# Patient Record
Sex: Male | Born: 1955 | Hispanic: No | Marital: Married
Health system: Southern US, Community
[De-identification: ages and names within clinical notes are randomized; demographics above are authoritative.]

---

## 2017-11-25 ENCOUNTER — Inpatient Hospital Stay (HOSPITAL_COMMUNITY)
Admission: EM | Admit: 2017-11-25 | Discharge: 2017-11-27 | Disposition: A | Discharge status: Home / Self Care | Payer: Self-pay | Attending: Cardiology | Admitting: Cardiology

## 2017-11-25 ENCOUNTER — Other Ambulatory Visit: Payer: Self-pay

## 2017-11-25 ENCOUNTER — Emergency Department (HOSPITAL_COMMUNITY): Payer: Self-pay

## 2017-11-25 DIAGNOSIS — Z720 Tobacco use: Secondary | ICD-10-CM

## 2017-11-25 DIAGNOSIS — Z955 Presence of coronary angioplasty implant and graft: Secondary | ICD-10-CM

## 2017-11-25 DIAGNOSIS — R079 Chest pain, unspecified: Principal | ICD-10-CM

## 2017-11-25 DIAGNOSIS — I471 Supraventricular tachycardia: Secondary | ICD-10-CM

## 2017-11-25 DIAGNOSIS — I214 Non-ST elevation (NSTEMI) myocardial infarction: Secondary | ICD-10-CM

## 2017-11-25 DIAGNOSIS — I21A1 Myocardial infarction type 2: Secondary | ICD-10-CM

## 2017-11-25 DIAGNOSIS — F1010 Alcohol abuse, uncomplicated: Secondary | ICD-10-CM

## 2017-11-25 LAB — I-STAT TROPONIN, ED
Troponin i, poc: 0.26 ng/mL (ref 0.00–0.08)
Troponin i, poc: 0.68 ng/mL (ref 0.00–0.08)

## 2017-11-25 LAB — CBC
HCT: 39.7 % (ref 39.0–52.0)
Hemoglobin: 13.7 g/dL (ref 13.0–17.0)
MCH: 32 pg (ref 26.0–34.0)
MCHC: 34.5 g/dL (ref 30.0–36.0)
MCV: 92.8 fL (ref 78.0–100.0)
Platelets: 203 10*3/uL (ref 150–400)
RBC: 4.28 MIL/uL (ref 4.22–5.81)
RDW: 12.8 % (ref 11.5–15.5)
WBC: 6.6 10*3/uL (ref 4.0–10.5)

## 2017-11-25 LAB — BASIC METABOLIC PANEL
Anion gap: 12 (ref 5–15)
BUN: 11 mg/dL (ref 6–20)
CO2: 20 mmol/L — ABNORMAL LOW (ref 22–32)
Calcium: 8.9 mg/dL (ref 8.9–10.3)
Chloride: 99 mmol/L — ABNORMAL LOW (ref 101–111)
Creatinine, Ser: 1.02 mg/dL (ref 0.61–1.24)
GFR calc Af Amer: >60 mL/min (ref >60)
GFR calc non Af Amer: >60 mL/min (ref >60)
Glucose, Bld: 151 mg/dL — ABNORMAL HIGH (ref 65–99)
Potassium: 3.6 mmol/L (ref 3.5–5.1)
Sodium: 131 mmol/L — ABNORMAL LOW (ref 135–145)

## 2017-11-25 LAB — HEPATIC FUNCTION PANEL
ALT: 21 U/L (ref 17–63)
AST: 33 U/L (ref 15–41)
Albumin: 3.9 g/dL (ref 3.5–5.0)
Alkaline Phosphatase: 56 U/L (ref 38–126)
Bilirubin, Direct: <0.1 mg/dL — ABNORMAL LOW (ref 0.1–0.5)
Total Bilirubin: 0.6 mg/dL (ref 0.3–1.2)
Total Protein: 7.1 g/dL (ref 6.5–8.1)

## 2017-11-25 LAB — T4, FREE: Free T4: 0.89 ng/dL (ref 0.61–1.12)

## 2017-11-25 LAB — PROTIME-INR
INR: 1.21
Prothrombin Time: 15.2 seconds (ref 11.4–15.2)

## 2017-11-25 LAB — HEMOGLOBIN A1C
Hgb A1c MFr Bld: 6.3 % — ABNORMAL HIGH (ref 4.8–5.6)
Mean Plasma Glucose: 134.11 mg/dL

## 2017-11-25 LAB — TROPONIN I: Troponin I: 0.42 ng/mL (ref <0.03)

## 2017-11-25 LAB — TSH: TSH: 0.732 u[IU]/mL (ref 0.350–4.500)

## 2017-11-25 LAB — MAGNESIUM: Magnesium: 1.9 mg/dL (ref 1.7–2.4)

## 2017-11-25 LAB — CBG MONITORING, ED: Glucose-Capillary: 252 mg/dL — ABNORMAL HIGH (ref 65–99)

## 2017-11-25 MED ORDER — SODIUM CHLORIDE 0.9 % IV SOLN: INTRAVENOUS | Status: DC

## 2017-11-25 MED ORDER — HEPARIN (PORCINE) IN NACL 100-0.45 UNIT/ML-% IJ SOLN
1000.0000 [IU]/h | INTRAMUSCULAR | Status: DC
  Administered 2017-11-25: 750 [IU]/h via INTRAVENOUS
  Filled 2017-11-25 (×2): qty 250

## 2017-11-25 MED ORDER — SODIUM CHLORIDE 0.9% FLUSH: 3.0000 mL | INTRAVENOUS | Status: DC | PRN

## 2017-11-25 MED ORDER — FENOFIBRATE 160 MG PO TABS
160.0000 mg | ORAL_TABLET | Freq: Every day | ORAL | Status: DC
  Administered 2017-11-26 – 2017-11-27 (×2): 160 mg via ORAL
  Filled 2017-11-25 (×5): qty 1

## 2017-11-25 MED ORDER — ROSUVASTATIN CALCIUM 40 MG PO TABS: 40.0000 mg | ORAL_TABLET | Freq: Every day | ORAL | Status: DC

## 2017-11-25 MED ORDER — FOLIC ACID 1 MG PO TABS
1.0000 mg | ORAL_TABLET | Freq: Every day | ORAL | Status: DC
  Administered 2017-11-25 – 2017-11-27 (×3): 1 mg via ORAL
  Filled 2017-11-25 (×3): qty 1

## 2017-11-25 MED ORDER — ASPIRIN EC 81 MG PO TBEC
81.0000 mg | DELAYED_RELEASE_TABLET | Freq: Every day | ORAL | Status: DC
  Administered 2017-11-27: 81 mg via ORAL
  Filled 2017-11-25 (×2): qty 1

## 2017-11-25 MED ORDER — METOPROLOL TARTRATE 25 MG PO TABS
50.0000 mg | ORAL_TABLET | Freq: Two times a day (BID) | ORAL | Status: DC
  Administered 2017-11-25 – 2017-11-27 (×4): 50 mg via ORAL
  Filled 2017-11-25 (×4): qty 2

## 2017-11-25 MED ORDER — LORAZEPAM 1 MG PO TABS: 1.0000 mg | ORAL_TABLET | Freq: Four times a day (QID) | ORAL | Status: DC | PRN

## 2017-11-25 MED ORDER — LORAZEPAM 2 MG/ML IJ SOLN: 1.0000 mg | Freq: Four times a day (QID) | INTRAMUSCULAR | Status: DC | PRN

## 2017-11-25 MED ORDER — HEPARIN BOLUS VIA INFUSION
3000.0000 [IU] | Freq: Once | INTRAVENOUS | Status: AC
  Administered 2017-11-25: 3000 [IU] via INTRAVENOUS
  Filled 2017-11-25: qty 3000

## 2017-11-25 MED ORDER — SODIUM CHLORIDE 0.9 % WEIGHT BASED INFUSION: 1.0000 mL/kg/h | INTRAVENOUS | Status: DC

## 2017-11-25 MED ORDER — VITAMIN B-1 100 MG PO TABS
100.0000 mg | ORAL_TABLET | Freq: Every day | ORAL | Status: DC
  Administered 2017-11-25 – 2017-11-27 (×3): 100 mg via ORAL
  Filled 2017-11-25 (×3): qty 1

## 2017-11-25 MED ORDER — CLOPIDOGREL BISULFATE 75 MG PO TABS
75.0000 mg | ORAL_TABLET | Freq: Every day | ORAL | Status: DC
  Administered 2017-11-26: 75 mg via ORAL
  Filled 2017-11-25: qty 1

## 2017-11-25 MED ORDER — SODIUM CHLORIDE 0.9 % WEIGHT BASED INFUSION
3.0000 mL/kg/h | INTRAVENOUS | Status: DC
  Administered 2017-11-26: 3 mL/kg/h via INTRAVENOUS

## 2017-11-25 MED ORDER — NITROGLYCERIN 0.4 MG SL SUBL: 0.4000 mg | SUBLINGUAL_TABLET | SUBLINGUAL | Status: DC | PRN

## 2017-11-25 MED ORDER — PANTOPRAZOLE SODIUM 40 MG PO TBEC
40.0000 mg | DELAYED_RELEASE_TABLET | Freq: Every day | ORAL | Status: DC
  Administered 2017-11-25 – 2017-11-27 (×3): 40 mg via ORAL
  Filled 2017-11-25 (×3): qty 1

## 2017-11-25 MED ORDER — INSULIN ASPART 100 UNIT/ML ~~LOC~~ SOLN
0.0000 [IU] | Freq: Three times a day (TID) | SUBCUTANEOUS | Status: DC
  Administered 2017-11-26: 1 [IU] via SUBCUTANEOUS
  Administered 2017-11-26: 2 [IU] via SUBCUTANEOUS
  Filled 2017-11-25: qty 1

## 2017-11-25 MED ORDER — ACETAMINOPHEN 325 MG PO TABS: 650.0000 mg | ORAL_TABLET | ORAL | Status: DC | PRN

## 2017-11-25 MED ORDER — SODIUM CHLORIDE 0.9 % IV SOLN: 250.0000 mL | INTRAVENOUS | Status: DC | PRN

## 2017-11-25 MED ORDER — ASPIRIN 81 MG PO CHEW
81.0000 mg | CHEWABLE_TABLET | ORAL | Status: AC
  Administered 2017-11-26: 81 mg via ORAL
  Filled 2017-11-25: qty 1

## 2017-11-25 MED ORDER — LISINOPRIL 20 MG PO TABS: 20.0000 mg | ORAL_TABLET | Freq: Every day | ORAL | Status: DC

## 2017-11-25 MED ORDER — ONDANSETRON HCL 4 MG/2ML IJ SOLN
4.0000 mg | Freq: Four times a day (QID) | INTRAMUSCULAR | Status: DC | PRN
  Filled 2017-11-25: qty 2

## 2017-11-25 MED ORDER — ASPIRIN 81 MG PO CHEW: 324.0000 mg | CHEWABLE_TABLET | Freq: Once | ORAL | Status: DC

## 2017-11-25 MED ORDER — SODIUM CHLORIDE 0.9% FLUSH
3.0000 mL | Freq: Two times a day (BID) | INTRAVENOUS | Status: DC
  Administered 2017-11-25: 3 mL via INTRAVENOUS

## 2017-11-25 MED ORDER — INSULIN ASPART 100 UNIT/ML ~~LOC~~ SOLN
0.0000 [IU] | Freq: Every day | SUBCUTANEOUS | Status: DC
  Administered 2017-11-25: 3 [IU] via SUBCUTANEOUS
  Filled 2017-11-25: qty 1

## 2017-11-25 MED ORDER — ADULT MULTIVITAMIN W/MINERALS CH
1.0000 | ORAL_TABLET | Freq: Every day | ORAL | Status: DC
  Administered 2017-11-25 – 2017-11-27 (×3): 1 via ORAL
  Filled 2017-11-25 (×3): qty 1

## 2017-11-25 MED ORDER — THIAMINE HCL 100 MG/ML IJ SOLN
100.0000 mg | Freq: Every day | INTRAMUSCULAR | Status: DC
  Filled 2017-11-25: qty 1
  Filled 2017-11-25: qty 2

## 2017-11-25 MED ORDER — LORAZEPAM 1 MG PO TABS: 0.0000 mg | ORAL_TABLET | Freq: Four times a day (QID) | ORAL | Status: DC

## 2017-11-25 MED ORDER — LORAZEPAM 1 MG PO TABS: 0.0000 mg | ORAL_TABLET | Freq: Two times a day (BID) | ORAL | Status: DC

## 2017-11-25 NOTE — H&P (Addendum)
Cardiology Admission History and Physical:   Patient ID: Jeffery Atkins; MRN: [id number]; DOB: Jun 17, 1956   Admission date: 11/25/2017  Primary Care Provider: Karle Plumber, MD Primary Cardiologist: No primary care provider on file. [place] Primary Electrophysiologist:  none  Chief Complaint:  Chest pain  Patient Profile:   Jeffery Atkins is a 62 y.o. male here with paroxysmal supraventricular tachycardia resolved with adenosine.,  Heavy smoker, heavy alcohol use, chest discomfort with SVT.  Elevated troponin.  History of Present Illness:   Jeffery Atkins is a 62 year old male with no prior documented history of paroxysmal supraventricular tachycardia, heavy smoker, heavy alcohol use who earlier this morning woke up feeling poor, left-sided chest discomfort radiating to his left arm with some generalized weakness.  He went to his primary care physician and was noted to be in supraventricular tachycardia at heart rate 157 bpm.  EMS was called, they gave him 6 mg of adenosine and this converted him to sinus rhythm at 87 bpm with occasional PACs.  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  These were fleeting episodes.  This episode today was the worst.  Nothing seemed to make it better.   He feels better, pain has improved.  His daughters are with him and do state that he drinks quite heavily when he is not at work.  No recent fevers chills nausea vomiting syncope.  He does have an occasional smoker's cough.  No recent increase in mucus production.  He was seen by [facility].  He is currently taking metoprolol tartrate 50 mg 2 times a day, HCTZ 25 mg daily, metformin 1000 mg 2 times a day, fenofibrate 145 mg once a day.    Past Medical History:  Diagnosis Date   Diabetes mellitus without complication (HCC)    Hypertension     History reviewed. No pertinent surgical history.    Medications Prior to Admission: Prior to Admission medications   Not on File     He is currently taking metoprolol tartrate 50 mg 2 times a day, HCTZ 25 mg daily, metformin 1000 mg 2 times a day, fenofibrate 145 mg once a day.   Allergies:   No Known Allergies  Social History:   Social History   Socioeconomic History   Marital status: Married    Spouse name: Not on file   Number of children: Not on file   Years of education: Not on file   Highest education level: Not on file  Occupational History   Not on file  Social Needs   Financial resource strain: Not on file   Food insecurity:    Worry: Not on file    Inability: Not on file   Transportation needs:    Medical: Not on file    Non-medical: Not on file  Tobacco Use   Smoking status: Current Every Day Smoker    Packs/day: 1.00    Types: Cigarettes  Substance and Sexual Activity   Alcohol use: Yes    Comment: 4 days per week   Drug use: Never   Sexual activity: Not on file  Lifestyle   Physical activity:    Days per week: Not on file    Minutes per session: Not on file   Stress: Not on file  Relationships   Social connections:    Talks on phone: Not on file    Gets together: Not on file  Attends religious service: Not on file    Active member of club or organization: Not on file    Attends meetings of clubs or organizations: Not on file    Relationship status: Not on file   Intimate partner violence:    Fear of current or ex partner: Not on file    Emotionally abused: Not on file    Physically abused: Not on file    Forced sexual activity: Not on file  Other Topics Concern   Not on file  Social History Narrative   Not on file    Family History:   The patient's no early family history of CAD  ROS:  Please see the history of present illness.  All other ROS reviewed and negative.     Physical Exam/Data:   Vitals:   11/25/17 1600 11/25/17 1700 11/25/17 1730 11/25/17 1739  BP: 102/67 106/69  104/69   Pulse: (!) 58 60 (!) 58 (!) 59  Resp: 12 12 13 12   Temp:      TempSrc:      SpO2: 97% 97% 97% 96%  Weight:      Height:        Intake/Output Summary (Last 24 hours) at 11/25/2017 1754 Last data filed at 11/25/2017 1632 Gross per 24 hour  Intake 200 ml  Output 1200 ml  Net -1000 ml   Filed Weights   11/25/17 1132  Weight: 142 lb (64.4 kg)   Body mass index is 20.97 kg/m.  General:  Well nourished, well developed, in no acute distress HEENT: normal Lymph: no adenopathy Neck: no JVD Endocrine:  No thryomegaly Vascular: No carotid bruits; FA pulses 2+ bilaterally without bruits  Cardiac:  normal S1, S2; RRR; no murmur  Lungs:  clear to auscultation bilaterally, no wheezing, rhonchi or rales  Abd: soft, nontender, no hepatomegaly  Ext: no edema Musculoskeletal:  No deformities, BUE and BLE strength normal and equal Skin: warm and dry  Neuro:  CNs 2-12 intact, no focal abnormalities noted Psych:  Normal affect    EKG:  The ECG that was done  was personally reviewed and demonstrates sinus rhythm 74 bpm with nonspecific ST-T wave J-point elevation and early precordial leads.  SVT noted on EMS - narrow QRS with retrograde P. 157bpm (reviewed personally and with Dr. Ladona Ridgelaylor. )  Relevant CV Studies: ECHO P  Laboratory Data:  Chemistry Recent Labs  Lab 11/25/17 1134  NA 131*  K 3.6  CL 99*  CO2 20*  GLUCOSE 151*  BUN 11  CREATININE 1.02  CALCIUM 8.9  GFRNONAA >60  GFRAA >60  ANIONGAP 12    No results for input(s): PROT, ALBUMIN, AST, ALT, ALKPHOS, BILITOT in the last 168 hours. Hematology Recent Labs  Lab 11/25/17 1134  WBC 6.6  RBC 4.28  HGB 13.7  HCT 39.7  MCV 92.8  MCH 32.0  MCHC 34.5  RDW 12.8  PLT 203   Cardiac EnzymesNo results for input(s): TROPONINI in the last 168 hours.  Recent Labs  Lab 11/25/17 1210 11/25/17 1510  TROPIPOC 0.26* 0.68*    BNPNo results for input(s): BNP, PROBNP in the last 168 hours.  DDimer No results for  input(s): DDIMER in the last 168 hours.  Radiology/Studies:  Dg Chest 2 View  Result Date: 11/25/2017 CLINICAL DATA:  Tachycardia and left arm pain this morning. History of hypertension, current smoker. Diabetes. EXAM: CHEST - 2 VIEW COMPARISON:  None in PACs FINDINGS: The lungs are adequately inflated. The lung markings are  coarse in the right infrahilar region posteriorly. The heart and pulmonary vascularity are normal. The mediastinum is normal in width. There is no pleural effusion. The bony thorax is unremarkable. IMPRESSION: Right lower lobe atelectasis or early pneumonia. No CHF. Followup PA and lateral chest X-ray is recommended in 3-4 weeks following trial of antibiotic therapy to ensure resolution and exclude underlying malignancy. Electronically Signed   By: [name] M.D.   On: 11/25/2017 13:24    Assessment and Plan:   Type II myocardial infarction, likely demand ischemia in the setting of supraventricular tachycardia in this 62 year old gentleman that is a heavy tobacco user, heavy alcohol use, diabetic on metformin.  Troponin increased from 0.26-0.68. -Likely demand ischemia in the setting of SVT, I think it would be reasonable to proceed with cardiac catheterization tomorrow.  I talked it over with he and his daughters and they are willing to proceed.  Understand risks and benefits of stroke, heart attack, death, renal impairment, bleeding. -I will placed on atorvastatin 80 mg once a day.  - ECHO  Tobacco use -Tobacco cessation counseling  Heavy alcohol use -CIWA protocol  Diabetes with hypertension -We will hold metformin.  I will also hold his HCTZ.  Paroxysmal supraventricular tachycardia -We will go ahead and give him his metoprolol 50 mg p.o. twice daily  Will have follow up with Dr. Lewayne Bunting of EP (discussed with him) for potential ablation.   NOTE: UNDER [facility] AND [facility] THERE IS ANOTHER PATIENT, NOT HIM. I HAVE MADE IT TEAM AWARE. HE DOES  NOT HAVE CAD OR STENTS.  Severity of Illness: The appropriate patient status for this patient is INPATIENT. Inpatient status is judged to be reasonable and necessary in order to provide the required intensity of service to ensure the patient's safety. The patient's presenting symptoms, physical exam findings, and initial radiographic and laboratory data in the context of their chronic comorbidities is felt to place them at high risk for further clinical deterioration. Furthermore, it is not anticipated that the patient will be medically stable for discharge from the hospital within 2 midnights of admission. The following factors support the patient status of inpatient.   " The patient's presenting symptoms include chest pain. " The worrisome physical exam findings include previous racing heart. " The initial radiographic and laboratory data are worrisome because of previous supraventricular tachycardia on ECG with nonspecific ST-T wave changes. " The chronic co-morbidities include diabetes and hypertension.   * I certify that at the point of admission it is my clinical judgment that the patient will require inpatient hospital care spanning beyond 2 midnights from the point of admission due to high intensity of service, high risk for further deterioration and high frequency of surveillance required.*    For questions or updates, please contact [facility] Please consult [email or web address] for contact info under Cardiology/STEMI.    Signed, Donato Schultz, MD  11/25/2017 5:54 PM

## 2017-11-25 NOTE — ED Notes (Signed)
Pt asked to use the bathroom. Pt informed that he needs to use a urinal. Per pt's family member, pt wanted to stand up to use the urinal. Pt informed that someone needed to be present, while he stood up, but pt refused. Pt decided not to use the bathroom.

## 2017-11-25 NOTE — ED Triage Notes (Addendum)
Per [facility], pt [facility] with complaint of chest pressure and left arm pressure that began this morning. [facility] and pt was found to be in SVT upon EMS arrival. Pt given 6 of adenosine and converted. Pt has no complaints now. VSS.

## 2017-11-25 NOTE — ED Notes (Signed)
Pt transferred to hospital bed for comfort.

## 2017-11-25 NOTE — ED Notes (Signed)
Critical troponin of 0.42 paged to the cardiology on call doctor

## 2017-11-25 NOTE — ED Notes (Signed)
PAtient requesting to stand up to use urinal. Patient advised that given his condition this is not safe. Patient proceeds to sit on side of the bed to urinate.

## 2017-11-25 NOTE — ED Notes (Signed)
Lab results given to Dr. Rees. 

## 2017-11-25 NOTE — ED Notes (Signed)
Dr. Madilyn Hookees notified of I stat troponin results

## 2017-11-25 NOTE — ED Notes (Signed)
Pt using urinal at bedside.

## 2017-11-25 NOTE — ED Provider Notes (Signed)
[facility] EMERGENCY DEPARTMENT Provider Note   CSN: [id number] Arrival date & time: 11/25/17  1124     History   Chief Complaint Chief Complaint  Patient presents with  . Chest Pain  . SVT    HPI Jeffery Atkins is a 62 y.o. male.  HPI 61 year old male past medical history significant for dyslipidemia, hypertension, CAD status post PCI in 2010, 2017 probably, diabetes that presents to the emergency department today after being referred by primary care for SVT.  Patient states that this morning he woke up and started having some left-sided chest pain that radiated to his left arm.  Describes the pain as pressure.  Denies any associated shortness of breath with it.  Did report being diaphoretic and nauseous.  Patient went to his primary care doctor where he had an EKG at that time and noted to be in SVT.  EKG EMS was notified.  Patient was given 6 mg of adenosine and converted to sinus rhythm.  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  Patient states he feels completely resolved of any pain or symptoms at this time.  Patient denies any cardiac history however on my review of patient's history he does see [facility] where he has a history of CAD with PCI with several restenosis.  Patient does have history of hypertension hyperlipidemia.  Patient denies any symptoms at this time.  Pt denies any fever, chill, ha, vision changes, lightheadedness, dizziness, congestion, neck pain, sob, cough, abd pain, n/v/d, urinary symptoms, change in bowel habits, melena, hematochezia, lower extremity paresthesias.  Past Medical History:  Diagnosis Date  . Diabetes mellitus without complication (HCC)   . Hypertension     There are no active problems to display for this patient.   History reviewed. No pertinent surgical history.      Home Medications    Prior to Admission medications   Not on File    Family  History History reviewed. No pertinent family history.  Social History Social History   Tobacco Use  . Smoking status: Current Every Day Smoker    Packs/day: 1.00    Types: Cigarettes  Substance Use Topics  . Alcohol use: Yes    Comment: 4 days per week  . Drug use: Never     Allergies   Patient has no known allergies.   Review of Systems Review of Systems  All other systems reviewed and are negative.    Physical Exam Updated Vital Signs BP 123/66   Pulse 92   Temp 98 F (36.7 C) (Oral)   Resp (!) 24   Ht 5\' 9"  (1.753 m)   Wt 64.4 kg (142 lb)   SpO2 97%   BMI 20.97 kg/m   Physical Exam  Constitutional: He is oriented to person, place, and time. He appears well-developed and well-nourished.  Non-toxic appearance. No distress.  HENT:  Head: Normocephalic and atraumatic.  Nose: Nose normal.  Mouth/Throat: Oropharynx is clear and moist.  Eyes: Pupils are equal, round, and reactive to light. Conjunctivae are normal. Right eye exhibits no discharge. Left eye exhibits no discharge.  Neck: Normal range of motion. Neck supple. No JVD present. No tracheal deviation present.  Cardiovascular: Normal rate, regular rhythm, normal heart sounds and intact distal pulses. Exam reveals no gallop and no friction rub.  No murmur heard. Pulmonary/Chest: Effort normal and breath sounds normal. No respiratory distress. He has no decreased breath sounds. He has  no wheezes. He has no rhonchi. He has no rales. He exhibits no tenderness.  No hypoxia or tachypnea.  Abdominal: Soft. Bowel sounds are normal. He exhibits no distension. There is no tenderness. There is no rebound and no guarding.  Musculoskeletal: Normal range of motion.       Right lower leg: Normal. He exhibits no edema.       Left lower leg: Normal. He exhibits no edema.  No lower extremity edema or calf tenderness.  Lymphadenopathy:    He has no cervical adenopathy.  Neurological: He is alert and oriented to person,  place, and time.  Skin: Skin is warm and dry. Capillary refill takes less than 2 seconds. He is not diaphoretic.  Psychiatric: His behavior is normal. Judgment and thought content normal.  Nursing note and vitals reviewed.    ED Treatments / Results  Labs (all labs ordered are listed, but only abnormal results are displayed) Labs Reviewed  BASIC METABOLIC PANEL - Abnormal; Notable for the following components:      Result Value   Sodium 131 (*)    Chloride 99 (*)    CO2 20 (*)    Glucose, Bld 151 (*)    All other components within normal limits  I-STAT TROPONIN, ED - Abnormal; Notable for the following components:   Troponin i, poc 0.26 (*)    All other components within normal limits  CBC    EKG EKG Interpretation  Date/Time:  Monday November 25 2017 11:46:40 EDT Ventricular Rate:  74 PR Interval:  162 QRS Duration: 84 QT Interval:  368 QTC Calculation: 408 R Axis:   68 Text Interpretation:  Normal sinus rhythm with sinus arrhythmia Possible Left atrial enlargement Borderline ECG Confirmed by Tilden Fossaees, Elizabeth [phone]([id number]) on 11/25/2017 12:21:58 PM   Radiology Dg Chest 2 View  Result Date: 11/25/2017 CLINICAL DATA:  Tachycardia and left arm pain this morning. History of hypertension, current smoker. Diabetes. EXAM: CHEST - 2 VIEW COMPARISON:  None in PACs FINDINGS: The lungs are adequately inflated. The lung markings are coarse in the right infrahilar region posteriorly. The heart and pulmonary vascularity are normal. The mediastinum is normal in width. There is no pleural effusion. The bony thorax is unremarkable. IMPRESSION: Right lower lobe atelectasis or early pneumonia. No CHF. Followup PA and lateral chest X-ray is recommended in 3-4 weeks following trial of antibiotic therapy to ensure resolution and exclude underlying malignancy. Electronically Signed   By: David  SwazilandJordan M.D.   On: 11/25/2017 13:24    Procedures Procedures (including critical care time)  Medications  Ordered in ED Medications  aspirin chewable tablet 324 mg (324 mg Oral Not Given 11/25/17 1245)     Initial Impression / Assessment and Plan / ED Course  I have reviewed the triage vital signs and the nursing notes.  Pertinent labs & imaging results that were available during my care of the patient were reviewed by me and considered in my medical decision making (see chart for details).     Patient presents to the emergency department today after being sent by primary care for SVT.  Patient does report some left-sided chest pain and left arm pain when he awoke from sleep this morning that resolved after receiving 6 mg of adenosine.  Patient at this time is pain-free and asymptomatic.  States he feels great.  Overall well-appearing and nontoxic.  Vital signs are reassuring.  Patient is afebrile, no tachycardia noted.  No hypotension noted.  Patient's lab work reveals no  leukocytosis.  Patient's initial troponin was 0.26.  Electrolytes are overall reassuring with some mild hyponatremia 131.  Normal kidney function.  Chest x-ray shows no acute ab normalities.  Clinical presentation not consistent with dissection or PE.  EKG shows normal sinus rhythm without any acute signs of ischemia.  Patient is not in SVT at this time.  Patient is pain-free.  We will consult cardiology for their medications and disposition.  Patient remains hemodynamic stable.  My attending concerning patient's plan of care and she is agreeable.  Care handoff to Dr. Clarene Duke. Pt has pending at this time cardiology consult and dispostion.  Disposition likely admitting pending lab and test results. Care dicussed and plan agreed upon with oncoming PA. Pt updated on plan of care and is currently hemodynamically stable at this time with normal vs.     Final Clinical Impressions(s) / ED Diagnoses   Final diagnoses:  Nonspecific chest pain  SVT (supraventricular tachycardia) [facility])    ED Discharge Orders    None        Wallace Keller 11/25/17 1623    Tilden Fossa, MD 11/26/17 [phone]

## 2017-11-25 NOTE — ED Notes (Signed)
CBG 252 

## 2017-11-25 NOTE — Progress Notes (Signed)
ANTICOAGULATION CONSULT NOTE - Initial Consult  Pharmacy Consult:  Heparin Indication: chest pain/ACS  No Known Allergies  Patient Measurements: Height: 5\' 9"  (175.3 cm) Weight: 142 lb (64.4 kg) IBW/kg (Calculated) : 70.7 Heparin Dosing Weight: 64 kg  Vital Signs: Temp: 98 F (36.7 C) (03/25 1131) Temp Source: Oral (03/25 1131) BP: 122/66 (03/25 2100) Pulse Rate: 63 (03/25 2100)  Labs: Recent Labs    11/25/17 1134  HGB 13.7  HCT 39.7  PLT 203  CREATININE 1.02    Estimated Creatinine Clearance: 70.2 mL/min (by C-G formula based on SCr of 1.02 mg/dL).   Medical History: Past Medical History:  Diagnosis Date  . CAD (coronary atherosclerotic disease) 11/25/2017  . Diabetes mellitus without complication (HCC)   . Hx of heart artery stent 11/25/2017  . Hypertension      Assessment: [age] YOM presented with chest pain and Pharmacy consulted to dose IV heparin.  Troponin trended up to 0.68.  Baseline labs and home meds reviewed.   Goal of Therapy:  Heparin level 0.3-0.7 units/ml Monitor platelets by anticoagulation protocol: Yes    Plan:  Heparin 3000 units IV bolus x 1, then Heparin gtt at 750 units/hr Check 6 hr heparin level Daily heparin level and CBC   Jullian Clayson D. Laney Potashang, PharmD, BCPS Pager:  [phone] 11/25/2017, 9:13 PM

## 2017-11-26 ENCOUNTER — Inpatient Hospital Stay (HOSPITAL_COMMUNITY)
Admission: EM | Disposition: A | Discharge status: Home / Self Care | Payer: Self-pay | Source: Home / Self Care | Attending: Cardiology

## 2017-11-26 ENCOUNTER — Other Ambulatory Visit: Payer: Self-pay

## 2017-11-26 ENCOUNTER — Other Ambulatory Visit (HOSPITAL_COMMUNITY): Payer: Self-pay

## 2017-11-26 DIAGNOSIS — I2510 Atherosclerotic heart disease of native coronary artery without angina pectoris: Secondary | ICD-10-CM

## 2017-11-26 DIAGNOSIS — I214 Non-ST elevation (NSTEMI) myocardial infarction: Secondary | ICD-10-CM

## 2017-11-26 HISTORY — PX: LEFT HEART CATH AND CORONARY ANGIOGRAPHY: CATH118249

## 2017-11-26 HISTORY — PX: CORONARY STENT INTERVENTION: CATH118234

## 2017-11-26 LAB — BASIC METABOLIC PANEL
Anion gap: 11 (ref 5–15)
BUN: 13 mg/dL (ref 6–20)
CO2: 23 mmol/L (ref 22–32)
Calcium: 8.9 mg/dL (ref 8.9–10.3)
Chloride: 104 mmol/L (ref 101–111)
Creatinine, Ser: 1.02 mg/dL (ref 0.61–1.24)
GFR calc Af Amer: >60 mL/min (ref >60)
GFR calc non Af Amer: >60 mL/min (ref >60)
Glucose, Bld: 124 mg/dL — ABNORMAL HIGH (ref 65–99)
Potassium: 3.9 mmol/L (ref 3.5–5.1)
Sodium: 138 mmol/L (ref 135–145)

## 2017-11-26 LAB — GLUCOSE, CAPILLARY
Glucose-Capillary: 126 mg/dL — ABNORMAL HIGH (ref 65–99)
Glucose-Capillary: 174 mg/dL — ABNORMAL HIGH (ref 65–99)
Glucose-Capillary: 124 mg/dL — ABNORMAL HIGH (ref 65–99)

## 2017-11-26 LAB — POCT ACTIVATED CLOTTING TIME: Activated Clotting Time: 296 seconds

## 2017-11-26 LAB — CBG MONITORING, ED: Glucose-Capillary: 126 mg/dL — ABNORMAL HIGH (ref 65–99)

## 2017-11-26 LAB — LIPID PANEL
Cholesterol: 177 mg/dL (ref 0–200)
HDL: 35 mg/dL — ABNORMAL LOW (ref >40)
LDL Cholesterol: UNDETERMINED mg/dL (ref 0–99)
Total CHOL/HDL Ratio: 5.1 RATIO
Triglycerides: 639 mg/dL — ABNORMAL HIGH (ref <150)
VLDL: UNDETERMINED mg/dL (ref 0–40)

## 2017-11-26 LAB — HEPARIN LEVEL (UNFRACTIONATED): Heparin Unfractionated: <0.1 IU/mL — ABNORMAL LOW (ref 0.30–0.70)

## 2017-11-26 LAB — CBC
HCT: 38.7 % — ABNORMAL LOW (ref 39.0–52.0)
Hemoglobin: 13.1 g/dL (ref 13.0–17.0)
MCH: 31.7 pg (ref 26.0–34.0)
MCHC: 33.9 g/dL (ref 30.0–36.0)
MCV: 93.7 fL (ref 78.0–100.0)
Platelets: 220 10*3/uL (ref 150–400)
RBC: 4.13 MIL/uL — ABNORMAL LOW (ref 4.22–5.81)
RDW: 13.1 % (ref 11.5–15.5)
WBC: 4.7 10*3/uL (ref 4.0–10.5)

## 2017-11-26 LAB — TROPONIN I
Troponin I: 0.38 ng/mL (ref <0.03)
Troponin I: 0.27 ng/mL (ref <0.03)

## 2017-11-26 SURGERY — LEFT HEART CATH AND CORONARY ANGIOGRAPHY
Anesthesia: LOCAL

## 2017-11-26 MED ORDER — FENTANYL CITRATE (PF) 100 MCG/2ML IJ SOLN
INTRAMUSCULAR | Status: AC
  Filled 2017-11-26: qty 2

## 2017-11-26 MED ORDER — MIDAZOLAM HCL 2 MG/2ML IJ SOLN
INTRAMUSCULAR | Status: DC | PRN
  Administered 2017-11-26 (×2): 1 mg via INTRAVENOUS

## 2017-11-26 MED ORDER — LABETALOL HCL 5 MG/ML IV SOLN: 10.0000 mg | INTRAVENOUS | Status: DC | PRN

## 2017-11-26 MED ORDER — VERAPAMIL HCL 2.5 MG/ML IV SOLN
INTRAVENOUS | Status: AC
  Filled 2017-11-26: qty 2

## 2017-11-26 MED ORDER — HEPARIN (PORCINE) IN NACL 2-0.9 UNIT/ML-% IJ SOLN
INTRAMUSCULAR | Status: AC
  Filled 2017-11-26: qty 1000

## 2017-11-26 MED ORDER — IOPAMIDOL (ISOVUE-370) INJECTION 76%
INTRAVENOUS | Status: DC | PRN
  Administered 2017-11-26: 150 mL via INTRA_ARTERIAL

## 2017-11-26 MED ORDER — MIDAZOLAM HCL 2 MG/2ML IJ SOLN
INTRAMUSCULAR | Status: AC
  Filled 2017-11-26: qty 2

## 2017-11-26 MED ORDER — TICAGRELOR 90 MG PO TABS
ORAL_TABLET | ORAL | Status: AC
  Filled 2017-11-26: qty 2

## 2017-11-26 MED ORDER — ACTIVE PARTNERSHIP FOR HEALTH OF YOUR HEART BOOK
Freq: Once | Status: AC
  Administered 2017-11-26: 1
  Filled 2017-11-26: qty 1

## 2017-11-26 MED ORDER — HEPARIN BOLUS VIA INFUSION
2000.0000 [IU] | Freq: Once | INTRAVENOUS | Status: AC
  Administered 2017-11-26: 2000 [IU] via INTRAVENOUS
  Filled 2017-11-26: qty 2000

## 2017-11-26 MED ORDER — HEPARIN SODIUM (PORCINE) 1000 UNIT/ML IJ SOLN
INTRAMUSCULAR | Status: AC
  Filled 2017-11-26: qty 1

## 2017-11-26 MED ORDER — NITROGLYCERIN 1 MG/10 ML FOR IR/CATH LAB
INTRA_ARTERIAL | Status: DC | PRN
  Administered 2017-11-26: 200 ug via INTRACORONARY

## 2017-11-26 MED ORDER — IOPAMIDOL (ISOVUE-370) INJECTION 76%
INTRAVENOUS | Status: AC
  Filled 2017-11-26: qty 50

## 2017-11-26 MED ORDER — SODIUM CHLORIDE 0.9% FLUSH
3.0000 mL | Freq: Two times a day (BID) | INTRAVENOUS | Status: DC
  Administered 2017-11-26: 3 mL via INTRAVENOUS

## 2017-11-26 MED ORDER — HYDRALAZINE HCL 20 MG/ML IJ SOLN: 5.0000 mg | INTRAMUSCULAR | Status: DC | PRN

## 2017-11-26 MED ORDER — VERAPAMIL HCL 2.5 MG/ML IV SOLN
INTRAVENOUS | Status: DC | PRN
  Administered 2017-11-26: 10 mL via INTRA_ARTERIAL

## 2017-11-26 MED ORDER — ATORVASTATIN CALCIUM 80 MG PO TABS: 80.0000 mg | ORAL_TABLET | Freq: Every day | ORAL | Status: DC

## 2017-11-26 MED ORDER — THE SENSUOUS HEART BOOK
Freq: Once | Status: AC
  Administered 2017-11-26: 1
  Filled 2017-11-26: qty 1

## 2017-11-26 MED ORDER — PNEUMOCOCCAL VAC POLYVALENT 25 MCG/0.5ML IJ INJ
0.5000 mL | INJECTION | INTRAMUSCULAR | Status: AC
Start: 2017-11-27 — End: 2017-11-27
  Administered 2017-11-27: 0.5 mL via INTRAMUSCULAR
  Filled 2017-11-26: qty 0.5

## 2017-11-26 MED ORDER — IOPAMIDOL (ISOVUE-370) INJECTION 76%
INTRAVENOUS | Status: AC
  Filled 2017-11-26: qty 100

## 2017-11-26 MED ORDER — HEART ATTACK BOUNCING BOOK
Freq: Once | Status: AC
  Administered 2017-11-26: 1
  Filled 2017-11-26: qty 1

## 2017-11-26 MED ORDER — SODIUM CHLORIDE 0.9 % IV SOLN: INTRAVENOUS | Status: DC

## 2017-11-26 MED ORDER — LIDOCAINE HCL (PF) 1 % IJ SOLN
INTRAMUSCULAR | Status: DC | PRN
  Administered 2017-11-26: 2 mL

## 2017-11-26 MED ORDER — SODIUM CHLORIDE 0.9% FLUSH: 3.0000 mL | INTRAVENOUS | Status: DC | PRN

## 2017-11-26 MED ORDER — ANGIOPLASTY BOOK
Freq: Once | Status: AC
  Administered 2017-11-26: 1
  Filled 2017-11-26: qty 1

## 2017-11-26 MED ORDER — NITROGLYCERIN 1 MG/10 ML FOR IR/CATH LAB
INTRA_ARTERIAL | Status: AC
  Filled 2017-11-26: qty 10

## 2017-11-26 MED ORDER — HEPARIN (PORCINE) IN NACL 2-0.9 UNIT/ML-% IJ SOLN
INTRAMUSCULAR | Status: DC | PRN
  Administered 2017-11-26 (×2): 500 mL

## 2017-11-26 MED ORDER — TICAGRELOR 90 MG PO TABS
90.0000 mg | ORAL_TABLET | Freq: Two times a day (BID) | ORAL | Status: DC
  Administered 2017-11-26 – 2017-11-27 (×2): 90 mg via ORAL
  Filled 2017-11-26 (×2): qty 1

## 2017-11-26 MED ORDER — TICAGRELOR 90 MG PO TABS
ORAL_TABLET | ORAL | Status: DC | PRN
  Administered 2017-11-26: 180 mg via ORAL

## 2017-11-26 MED ORDER — FENTANYL CITRATE (PF) 100 MCG/2ML IJ SOLN
INTRAMUSCULAR | Status: DC | PRN
  Administered 2017-11-26 (×2): 25 ug via INTRAVENOUS

## 2017-11-26 MED ORDER — SODIUM CHLORIDE 0.9 % IV SOLN: 250.0000 mL | INTRAVENOUS | Status: DC | PRN

## 2017-11-26 MED ORDER — HEPARIN SODIUM (PORCINE) 1000 UNIT/ML IJ SOLN
INTRAMUSCULAR | Status: DC | PRN
  Administered 2017-11-26: 2000 [IU] via INTRAVENOUS
  Administered 2017-11-26 (×2): 3500 [IU] via INTRAVENOUS

## 2017-11-26 MED ORDER — LIDOCAINE HCL (PF) 1 % IJ SOLN
INTRAMUSCULAR | Status: AC
  Filled 2017-11-26: qty 30

## 2017-11-26 SURGICAL SUPPLY — 19 items
BALLN SAPPHIRE 2.5X12 (BALLOONS) ×2
BALLN SAPPHIRE ~~LOC~~ 3.5X18 (BALLOONS) ×2 IMPLANT
BALLOON SAPPHIRE 2.5X12 (BALLOONS) ×1 IMPLANT
BAND ZEPHYR COMPRESS 30 LONG (HEMOSTASIS) ×2 IMPLANT
CATH IMPULSE 5F ANG/FL3.5 (CATHETERS) ×2 IMPLANT
CATH VISTA GUIDE 6FR XBLAD3.5 (CATHETERS) ×2 IMPLANT
GUIDELINER 6F (CATHETERS) ×2 IMPLANT
GUIDEWIRE INQWIRE 1.5J.035X260 (WIRE) ×1 IMPLANT
INQWIRE 1.5J .035X260CM (WIRE) ×2
KIT ENCORE 26 ADVANTAGE (KITS) ×2 IMPLANT
KIT HEART LEFT (KITS) ×2 IMPLANT
NEEDLE PERC 21GX4CM (NEEDLE) ×2 IMPLANT
PACK CARDIAC CATHETERIZATION (CUSTOM PROCEDURE TRAY) ×2 IMPLANT
SHEATH RAIN RADIAL 21G 6FR (SHEATH) ×2 IMPLANT
STENT SYNERGY DES 3.5X32 (Permanent Stent) ×2 IMPLANT
SYR MEDRAD MARK V 150ML (SYRINGE) ×2 IMPLANT
TRANSDUCER W/STOPCOCK (MISCELLANEOUS) ×2 IMPLANT
TUBING CIL FLEX 10 FLL-RA (TUBING) ×2 IMPLANT
WIRE COUGAR XT STRL 190CM (WIRE) ×2 IMPLANT

## 2017-11-26 NOTE — ED Notes (Signed)
Pre cath lab RN at bedside to obtain consent. Consent at bedside

## 2017-11-26 NOTE — Progress Notes (Signed)
ANTICOAGULATION CONSULT NOTE - Follow Up Consult  Pharmacy Consult for heparin Indication: chest pain/ACS  Labs: Recent Labs    11/25/17 1134 11/25/17 2004 11/25/17 2038 11/26/17 0240  HGB 13.7  --   --   --   HCT 39.7  --   --   --   PLT 203  --   --   --   LABPROT  --  15.2  --   --   INR  --  1.21  --   --   HEPARINUNFRC  --   --   --  <0.10*  CREATININE 1.02  --   --   --   TROPONINI  --   --  0.42* 0.38*    Assessment: 62yo male undetectable on heparin with initial dosing for CP.  Goal of Therapy:  Heparin level 0.3-0.7 units/ml   Plan:  Will rebolus with heparin 2000 units and increase heparin gtt by 4 units/kg/hr to 1000 units/hr and check level in 6 hours.    Jeffery GamblesVeronda Di Atkins, PharmD, BCPS  11/26/2017,4:27 AM

## 2017-11-26 NOTE — ED Notes (Signed)
Pt placed on hospital bed for comfort.

## 2017-11-26 NOTE — Progress Notes (Signed)
C/O shortness of breath. BP up some. Skin w/d. Dr. Clifton JamesMcAlhany made aware. Given a diet coke to drink. Explained to patient and family it may be due to the Brilinta.

## 2017-11-26 NOTE — Interval H&P Note (Signed)
History and Physical Interval Note:  11/26/2017 10:06 AM  Jeffery Atkins  has presented today for cardiac cath with the diagnosis of NSTEMI  The various methods of treatment have been discussed with the patient and family. After consideration of risks, benefits and other options for treatment, the patient has consented to  Procedure(s): LEFT HEART CATH AND CORONARY ANGIOGRAPHY (N/A) as a surgical intervention .  The patient's history has been reviewed, patient examined, no change in status, stable for surgery.  I have reviewed the patient's chart and labs.  Questions were answered to the patient's satisfaction.    Cath Lab Visit (complete for each Cath Lab visit)  Clinical Evaluation Leading to the Procedure:   ACS: Yes.    Non-ACS:    Anginal Classification: CCS III  Anti-ischemic medical therapy: Minimal Therapy (1 class of medications)  Non-Invasive Test Results: No non-invasive testing performed  Prior CABG: No previous CABG       Jeffery Atkins

## 2017-11-27 ENCOUNTER — Telehealth: Payer: Self-pay | Admitting: Cardiology

## 2017-11-27 ENCOUNTER — Inpatient Hospital Stay (HOSPITAL_COMMUNITY): Payer: Self-pay

## 2017-11-27 DIAGNOSIS — I340 Nonrheumatic mitral (valve) insufficiency: Secondary | ICD-10-CM

## 2017-11-27 DIAGNOSIS — F1020 Alcohol dependence, uncomplicated: Secondary | ICD-10-CM

## 2017-11-27 DIAGNOSIS — E782 Mixed hyperlipidemia: Secondary | ICD-10-CM

## 2017-11-27 LAB — CBC
HCT: 38.8 % — ABNORMAL LOW (ref 39.0–52.0)
Hemoglobin: 13.1 g/dL (ref 13.0–17.0)
MCH: 31.4 pg (ref 26.0–34.0)
MCHC: 33.8 g/dL (ref 30.0–36.0)
MCV: 93 fL (ref 78.0–100.0)
Platelets: 210 10*3/uL (ref 150–400)
RBC: 4.17 MIL/uL — ABNORMAL LOW (ref 4.22–5.81)
RDW: 12.8 % (ref 11.5–15.5)
WBC: 6 10*3/uL (ref 4.0–10.5)

## 2017-11-27 LAB — BASIC METABOLIC PANEL
Anion gap: 9 (ref 5–15)
BUN: 14 mg/dL (ref 6–20)
CO2: 24 mmol/L (ref 22–32)
Calcium: 8.7 mg/dL — ABNORMAL LOW (ref 8.9–10.3)
Chloride: 103 mmol/L (ref 101–111)
Creatinine, Ser: 1.06 mg/dL (ref 0.61–1.24)
GFR calc Af Amer: >60 mL/min (ref >60)
GFR calc non Af Amer: >60 mL/min (ref >60)
Glucose, Bld: 123 mg/dL — ABNORMAL HIGH (ref 65–99)
Potassium: 3.8 mmol/L (ref 3.5–5.1)
Sodium: 136 mmol/L (ref 135–145)

## 2017-11-27 LAB — ECHOCARDIOGRAM COMPLETE
Height: 69 in
Weight: 2292.78 oz

## 2017-11-27 LAB — GLUCOSE, CAPILLARY
Glucose-Capillary: 114 mg/dL — ABNORMAL HIGH (ref 65–99)
Glucose-Capillary: 119 mg/dL — ABNORMAL HIGH (ref 65–99)

## 2017-11-27 LAB — HIV ANTIBODY (ROUTINE TESTING W REFLEX): HIV Screen 4th Generation wRfx: NONREACTIVE

## 2017-11-27 MED ORDER — NITROGLYCERIN 0.4 MG SL SUBL: 0.4000 mg | SUBLINGUAL_TABLET | SUBLINGUAL | 3 refills | Status: DC | PRN

## 2017-11-27 MED ORDER — ASPIRIN 81 MG PO TBEC: 81.0000 mg | DELAYED_RELEASE_TABLET | Freq: Every day | ORAL | Status: DC

## 2017-11-27 MED ORDER — TICAGRELOR 90 MG PO TABS: 90.0000 mg | ORAL_TABLET | Freq: Two times a day (BID) | ORAL | 3 refills | Status: DC

## 2017-11-27 MED ORDER — FENOFIBRATE 160 MG PO TABS: 160.0000 mg | ORAL_TABLET | Freq: Every day | ORAL | Status: DC

## 2017-11-27 MED ORDER — PANTOPRAZOLE SODIUM 40 MG PO TBEC: 40.0000 mg | DELAYED_RELEASE_TABLET | Freq: Every day | ORAL | 1 refills | Status: DC

## 2017-11-27 MED ORDER — HYDROCHLOROTHIAZIDE 25 MG PO TABS
25.0000 mg | ORAL_TABLET | Freq: Every day | ORAL | Status: DC
  Administered 2017-11-27: 25 mg via ORAL
  Filled 2017-11-27: qty 1

## 2017-11-27 MED ORDER — ATORVASTATIN CALCIUM 80 MG PO TABS: 80.0000 mg | ORAL_TABLET | Freq: Every day | ORAL | 3 refills | Status: DC

## 2017-11-27 MED ORDER — TICAGRELOR 90 MG PO TABS: 90.0000 mg | ORAL_TABLET | Freq: Two times a day (BID) | ORAL | 0 refills | Status: DC

## 2017-11-27 MED ORDER — METOPROLOL TARTRATE 50 MG PO TABS: 50.0000 mg | ORAL_TABLET | Freq: Two times a day (BID) | ORAL | 11 refills | Status: DC

## 2017-11-27 MED FILL — Heparin Sodium (Porcine) Inj 1000 Unit/ML: INTRAMUSCULAR | Qty: 10 | Status: AC

## 2017-11-27 MED FILL — Heparin Sodium (Porcine) 2 Unit/ML in Sodium Chloride 0.9%: INTRAMUSCULAR | Qty: 1000 | Status: AC

## 2017-11-27 NOTE — Progress Notes (Signed)
Progress Note  Patient Name: Jeffery Atkins Date of Encounter: 11/27/2017  Primary Cardiologist: No primary care provider on file. New Jeffery Atkins  Subjective   Feels well, no SOB, no CP. Had mild SOB post procedure, ?Brilinta. This morning feels well. Happy  Inpatient Medications    Scheduled Meds: . aspirin EC  81 mg Oral Daily  . atorvastatin  80 mg Oral q1800  . fenofibrate  160 mg Oral Daily  . folic acid  1 mg Oral Daily  . insulin aspart  0-5 Units Subcutaneous QHS  . insulin aspart  0-9 Units Subcutaneous TID WC  . LORazepam  0-4 mg Oral Q6H   Followed by  . LORazepam  0-4 mg Oral Q12H  . metoprolol tartrate  50 mg Oral BID  . multivitamin with minerals  1 tablet Oral Daily  . pantoprazole  40 mg Oral Daily  . pneumococcal 23 valent vaccine  0.5 mL Intramuscular Tomorrow-1000  . sodium chloride flush  3 mL Intravenous Q12H  . thiamine  100 mg Oral Daily   Or  . thiamine  100 mg Intravenous Daily  . ticagrelor  90 mg Oral BID   Continuous Infusions: . sodium chloride Stopped (11/26/17 1900)  . sodium chloride     PRN Meds: sodium chloride, acetaminophen, LORazepam **OR** LORazepam, nitroGLYCERIN, ondansetron (ZOFRAN) IV, sodium chloride flush   Vital Signs    Vitals:   11/26/17 2000 11/26/17 2005 11/27/17 0225 11/27/17 0711  BP:  (!) 158/81 (!) 158/90 (!) 175/83  Pulse: (!) 57 (!) 59 60 66  Resp: 20 19 17 13   Temp:  98.2 F (36.8 C) 97.7 F (36.5 C) 98 F (36.7 C)  TempSrc:  Oral Oral Oral  SpO2: 100% 100% 99% 96%  Weight:   143 lb 4.8 oz (65 kg)   Height:        Intake/Output Summary (Last 24 hours) at 11/27/2017 0841 Last data filed at 11/27/2017 0720 Gross per 24 hour  Intake 1134.42 ml  Output -  Net 1134.42 ml   Filed Weights   11/25/17 1132 11/27/17 0225  Weight: 142 lb (64.4 kg) 143 lb 4.8 oz (65 kg)    Telemetry    No VT, NSR - Personally Reviewed  ECG    SB 51, no ischemic changes from this AM - Personally Reviewed  Physical Exam     GEN: No acute distress.   Neck: No JVD Cardiac: RRR, no murmurs, rubs, or gallops.  Respiratory: Clear to auscultation bilaterally. GI: Soft, nontender, non-distended  MS: No edema; No deformity. Cath site normal Neuro:  Nonfocal  Psych: Normal affect   Labs    Chemistry Recent Labs  Lab 11/25/17 1134 11/25/17 2038 11/26/17 0849 11/27/17 0221  NA 131*  --  138 136  K 3.6  --  3.9 3.8  CL 99*  --  104 103  CO2 20*  --  23 24  GLUCOSE 151*  --  124* 123*  BUN 11  --  13 14  CREATININE 1.02  --  1.02 1.06  CALCIUM 8.9  --  8.9 8.7*  PROT  --  7.1  --   --   ALBUMIN  --  3.9  --   --   AST  --  33  --   --   ALT  --  21  --   --   ALKPHOS  --  56  --   --   BILITOT  --  0.6  --   --  GFRNONAA >60  --  >60 >60  GFRAA >60  --  >60 >60  ANIONGAP 12  --  11 9     Hematology Recent Labs  Lab 11/25/17 1134 11/26/17 0849 11/27/17 0221  WBC 6.6 4.7 6.0  RBC 4.28 4.13* 4.17*  HGB 13.7 13.1 13.1  HCT 39.7 38.7* 38.8*  MCV 92.8 93.7 93.0  MCH 32.0 31.7 31.4  MCHC 34.5 33.9 33.8  RDW 12.8 13.1 12.8  PLT 203 220 210    Cardiac Enzymes Recent Labs  Lab 11/25/17 2038 11/26/17 0240 11/26/17 0849  TROPONINI 0.42* 0.38* 0.27*    Recent Labs  Lab 11/25/17 1210 11/25/17 1510  TROPIPOC 0.26* 0.68*     BNPNo results for input(s): BNP, PROBNP in the last 168 hours.   DDimer No results for input(s): DDIMER in the last 168 hours.   Radiology    Dg Chest 2 View  Result Date: 11/25/2017 CLINICAL DATA:  Tachycardia and left arm pain this morning. History of hypertension, current smoker. Diabetes. EXAM: CHEST - 2 VIEW COMPARISON:  None in PACs FINDINGS: The lungs are adequately inflated. The lung markings are coarse in the right infrahilar region posteriorly. The heart and pulmonary vascularity are normal. The mediastinum is normal in width. There is no pleural effusion. The bony thorax is unremarkable. IMPRESSION: Right lower lobe atelectasis or early pneumonia. No  CHF. Followup PA and lateral chest X-ray is recommended in 3-4 weeks following trial of antibiotic therapy to ensure resolution and exclude underlying malignancy. Electronically Signed   By: [name] M.D.   On: 11/25/2017 13:24    Cardiac Studies   Cath 11/26/17:  Prox RCA lesion is 50% stenosed.  Prox RCA to Mid RCA lesion is 60% stenosed.  Mid RCA lesion is 90% stenosed.  Prox Cx lesion is 50% stenosed.  Ost 1st Mrg lesion is 50% stenosed.  Ost LAD to Prox LAD lesion is 30% stenosed.  Prox LAD-1 lesion is 90% stenosed.  Prox LAD-2 lesion is 40% stenosed.  Mid LAD lesion is 40% stenosed.  A drug-eluting stent was successfully placed using a STENT SYNERGY DES 3.5X32.  Post intervention, there is a 0% residual stenosis.  Post intervention, there is a 0% residual stenosis.  Post intervention, there is a 0% residual stenosis.  The left ventricular systolic function is normal.  LV end diastolic pressure is normal.  The left ventricular ejection fraction is 55-65% by visual estimate.  There is no mitral valve regurgitation.   1. Triple vessel CAD 2. Severe stenosis mid LAD. Successful placement DES x 1 mid LAD 3. Moderate stenosis in the mid to distal LAD and in the proximal Circumflex and the first OM branch.  4. Small non-dominant RCA with severe mid stenosis 5. Normal LV systolic function  Recommendations: Will plan one year of DAPT with ASA/Brilinta. Continue beta blocker. Start high dose statin.   Patient Profile     62 y.o. male with Type 2 MI in the setting of SVT which broke with adenosine. Found to have CAD as above with LAD stent. ETOH, DM with HTN  Assessment & Plan    Type 2 MI  - demand ischemia in the setting of SVT  - continue with DAPT, LAD DES. Watch for bleeding. If SOB occurs after Brilinta, consider change to Plavix after one month  - EF normal.   - Continue with metoprolol. Sinus brady noted at rest. Hopefully this will suppress SVT. If  this occurs again, Dr. Lewayne Bunting  EP for ablation (discussed case with him)  CAD  - 3v dz. As above. DES to LAD. Agressive mgt. Statin DAPT. Cardiac rehab.   ETOH abuse  - counsel on cessation.   DM with HTN  - diet controlled and Metformin. Ok to restart as outpt. Consider Jardiance in future.   - restart HCTZ.   Mixed hyperlipidemia  - atorva 80  - fenofibrate 145  Tobacco abuse  - cessation counsel   Ok for DC. TOC visit in 7 days with APP.    For questions or updates, please contact [facility] Please consult [email or web address] for contact info under Cardiology/STEMI.      Signed, Donato Schultz, MD  11/27/2017, 8:41 AM

## 2017-11-27 NOTE — Progress Notes (Signed)
  Echocardiogram 2D Echocardiogram has been performed.  Jeffery Atkins T Kimball Appleby 11/27/2017, 11:30 AM

## 2017-11-27 NOTE — Progress Notes (Signed)
CARDIAC REHAB PHASE I   PRE:  Rate/Rhythm: 53 SB  BP:  Supine:   Sitting: 163/76  Standing:    SaO2:   MODE:  Ambulation: 800 ft   POST:  Rate/Rhythm: 73 SR  BP:  Supine:   Sitting: 172/81  Standing:    SaO2:  [id number] MI education completed with pt and wife who voiced understanding. Stressed importance of brilinta with stent. Needs to see case manager. Discussed smoking cessation and gave fake cigarette. Pt not committing to quitting just yet. Wife tearful at times because he is not ready to quit. Discussed MI restrictions, NTG use, ex ed , alcohol cessation, and gave diabetic and heart healthy diets. Discussed heart healthy food choices and watching sodium. Their daughter is a nutritionist per wife. Will refer to [place] [facility] at request of wife. Pt works in [employer]. Pt walked 800 ft with steady gait and no CP. Tolerated well.   Luetta Nuttingharlene Hadlee Burback, RN BSN  11/27/2017 9:10 AM

## 2017-11-27 NOTE — Telephone Encounter (Signed)
**Note De-Identified Kerrion Kemppainen Obfuscation** Pt has not been discharged from hospital yet. Will call back.

## 2017-11-27 NOTE — Care Management Note (Addendum)
Case Management Note  Patient Details  Name: Jeffery Atkins MRN: [id number] Date of Birth: 10/24/1955  Subjective/Objective:  Pt presented for Cardiac Cath- PTA from home with family support. Plan for home on Brilinta. Co-pay will be $80.00-pt is aware of cost. Co pay card provided.                    Action/Plan: [facility] does not have medication in stock. [address] [facility] medication available [phone]. Pt is aware that the recommended pharmacy does not have in stock. No further needs from CM at this time.  Expected Discharge Date:                  Expected Discharge Plan:  Home/Self Care  In-House Referral:  NA  Discharge planning Services  CM Consult, Medication Assistance  Post Acute Care Choice:  NA Choice offered to:  NA  DME Arranged:  N/A DME Agency:  NA  HH Arranged:  NA HH Agency:  NA  Status of Service:  Completed, signed off  If discussed at Long Length of Stay Meetings, dates discussed:    Additional Comments:  Jeffery Atkins, Jeffery Feeley Kaye, RN 11/27/2017, 11:26 AM

## 2017-11-27 NOTE — Discharge Summary (Addendum)
Discharge Summary    Patient ID: Jeffery Atkins,  MRN: [id number], DOB/AGE: 60/04/1956 60 y.o.  Admit date: 11/25/2017 Discharge date: 11/27/2017  Primary Care Provider: Karle Atkins, Jeffery Atkins Primary Cardiologist: Jeffery SchultzMark Morgon Pamer, MD  Discharge Diagnoses    Principal Problem:   Non-ST elevation (NSTEMI) myocardial infarction [facility](HCC) Active Problems:   CAD (coronary atherosclerotic disease)   Hx of heart artery stent   SVT (supraventricular tachycardia) (HCC)   Allergies No Known Allergies  Diagnostic Studies/Procedures    Cath 11/26/2017 Conclusion     Prox RCA lesion is 50% stenosed. Prox RCA to Mid RCA lesion is 60% stenosed. Mid RCA lesion is 90% stenosed. Prox Cx lesion is 50% stenosed. Ost 1st Mrg lesion is 50% stenosed. Ost LAD to Prox LAD lesion is 30% stenosed. Prox LAD-1 lesion is 90% stenosed. Prox LAD-2 lesion is 40% stenosed. Mid LAD lesion is 40% stenosed. A drug-eluting stent was successfully placed using a STENT SYNERGY DES 3.5X32. Post intervention, there is a 0% residual stenosis. Post intervention, there is a 0% residual stenosis. Post intervention, there is a 0% residual stenosis. The left ventricular systolic function is normal. LV end diastolic pressure is normal. The left ventricular ejection fraction is 55-65% by visual estimate. There is no mitral valve regurgitation.   1. Triple vessel CAD 2. Severe stenosis mid LAD. Successful placement DES x 1 mid LAD 3. Moderate stenosis in the mid to distal LAD and in the proximal Circumflex and the first OM branch.  4. Small non-dominant RCA with severe mid stenosis 5. Normal LV systolic function   Recommendations: Will plan one year of DAPT with ASA/Brilinta. Continue beta blocker. Start high dose statin.        Echo 11/27/2017 LV EF: 60% -   65%  Study Conclusions   - Left ventricle: The cavity size was normal. Wall thickness was   normal. Systolic function was normal. The estimated ejection    fraction was in the range of 60% to 65%. Wall motion was normal;   there were no regional wall motion abnormalities. Left   ventricular diastolic function parameters were normal. - Aortic valve: There is a calcified nodule on the noncoronary   cusp. There was no stenosis. - Mitral valve: Mildly calcified annulus. Mildly calcified leaflets   . There was trivial regurgitation. - Right ventricle: The cavity size was normal. Systolic function   was normal. - Tricuspid valve: Peak RV-RA gradient (S): 27 mm Hg. - Pulmonary arteries: PA peak pressure: 30 mm Hg (S). - Inferior vena cava: The vessel was normal in size. The   respirophasic diameter changes were in the normal range (= 50%),   consistent with normal central venous pressure.   Impressions:   - Normal LV size and systolic function, EF 60-65%. Normal diastolic   function. Normal RV size and systolic function. No significant   valvular abnormalities. _____________   History of Present Illness     Mr. Jeffery Atkins is a 62 year old male with no prior documented history of paroxysmal supraventricular tachycardia, heavy smoker, heavy alcohol use who earlier this morning woke up feeling poor, left-sided chest discomfort radiating to his left arm with some generalized weakness.  He went to his primary care physician and was noted to be in supraventricular tachycardia at heart rate 157 bpm.  EMS was called, they gave him 6 mg of adenosine and this converted him to sinus rhythm at 87 bpm with occasional PACs.   He did state that twice before while  at work, a Estate agent, he had felt some racing heartbeat with some associated lightheadedness.  These were fleeting episodes.  This episode today was the worst.  Nothing seemed to make it better.    He feels better, pain has improved.  His daughters are with him and do state that he drinks quite heavily when he is not at work.  No recent fevers chills nausea vomiting syncope.  He does have an occasional  smoker's cough.  No recent increase in mucus production.   He was seen by [facility] medical clinic.   He is currently taking metoprolol tartrate 50 mg 2 times a day, HCTZ 25 mg daily, metformin 1000 mg 2 times a day, fenofibrate 145 mg once a day.    Hospital Course     Patient underwent scheduled cardiac catheterization on 11/26/2017 which showed left dominant system with small RCA, 50% proximal RCA, 60% proximal to mid RCA lesion, 90% mid RCA lesion, 90% proximal LAD lesion treated with 3.5 x 32 mm Synergy DES, 50% proximal left circumflex lesion.  He did have a elevated troponin, however according to Dr. Anne Fu note, the elevation of the troponin likely demand ischemia in the setting of SVT.  Postprocedure, he was placed on aspirin and Brilinta.  He was also placed on CIWA protocol as well. Patient was seen in the morning of 11/27/2017, at which time he was doing well.  He is deemed stable for discharge from cardiology perspective.  We plan to have the patient follow-up in the cardiology clinic in 7 days for transitional care visit.  Patient has been counseled on tobacco cessation as well.  He can resume metformin 48 hours after discharge.  Patient has been referred to [facility] cardiac rehab [facility].  Echocardiogram discharge showed normal ejection fraction and no significant valvular issue.    Prior to discharge, we had a long conversation, he is aware to stop smoking and the need to cut back on drinking.  His lipid panel obtained on 6/13 showed total cholesterol 177, HDL 35, triglycerides 639, LDL was unable to be calculated.  High-dose Lipitor was added during this admission.  He will need a fasting lipid panel and LFT in 6-8 weeks after discharge.  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  _____________  Discharge Vitals Blood pressure (!) 153/86, pulse (!) 53, temperature 98 F (36.7  C), temperature source Oral, resp. rate 13, height 5\' 9"  (1.753 Atkins), weight 143 lb 4.8 oz (65 kg), SpO2 99 %.  Filed Weights   11/25/17 1132 11/27/17 0225  Weight: 142 lb (64.4 kg) 143 lb 4.8 oz (65 kg)    Labs & Radiologic Studies    CBC Recent Labs    11/26/17 0849 11/27/17 0221  WBC 4.7 6.0  HGB 13.1 13.1  HCT 38.7* 38.8*  MCV 93.7 93.0  PLT 220 210   Basic Metabolic Panel Recent Labs    69/62/95 2038 11/26/17 0849 11/27/17 0221  NA  --  138 136  K  --  3.9 3.8  CL  --  104 103  CO2  --  23 24  GLUCOSE  --  124* 123*  BUN  --  13 14  CREATININE  --  1.02 1.06  CALCIUM  --  8.9 8.7*  MG 1.9  --   --    Liver Function Tests Recent Labs    11/25/17  2038  AST 33  ALT 21  ALKPHOS 56  BILITOT 0.6  PROT 7.1  ALBUMIN 3.9   No results for input(s): LIPASE, AMYLASE in the last 72 hours. Cardiac Enzymes Recent Labs    11/25/17 2038 11/26/17 0240 11/26/17 0849  TROPONINI 0.42* 0.38* 0.27*   BNP Invalid input(s): POCBNP D-Dimer No results for input(s): DDIMER in the last 72 hours. Hemoglobin A1C Recent Labs    11/25/17 2038  HGBA1C 6.3*   Fasting Lipid Panel Recent Labs    11/26/17 0240  CHOL 177  HDL 35*  LDLCALC UNABLE TO CALCULATE IF TRIGLYCERIDE OVER 400 mg/dL  TRIG 829*  CHOLHDL 5.1   Thyroid Function Tests Recent Labs    11/25/17 2038  TSH 0.732   _____________  Dg Chest 2 View  Result Date: 11/25/2017 CLINICAL DATA:  Tachycardia and left arm pain this morning. History of hypertension, current smoker. Diabetes. EXAM: CHEST - 2 VIEW COMPARISON:  None in PACs FINDINGS: The lungs are adequately inflated. The lung markings are coarse in the right infrahilar region posteriorly. The heart and pulmonary vascularity are normal. The mediastinum is normal in width. There is no pleural effusion. The bony thorax is unremarkable. IMPRESSION: Right lower lobe atelectasis or early pneumonia. No CHF. Followup PA and lateral chest X-ray is recommended in  3-4 weeks following trial of antibiotic therapy to ensure resolution and exclude underlying malignancy. Electronically Signed   By: [name] Atkins.D.   On: 11/25/2017 13:24   Disposition   Pt is being discharged home today in good condition.  Follow-up Plans & Appointments    Follow-up Information     Leone Brand, NP Follow up on 12/12/2017.   Specialties:  Cardiology, Radiology Why:  11:30AM. Cardiology followup.  Contact information: [address] [place] [address] [phone]         Jeffery Plumber, MD. Schedule an appointment as soon as possible for a visit.   Specialty:  Internal Medicine Contact information: [phone] [place] [address] [phone]           Discharge Instructions     Amb Referral to Cardiac Rehabilitation   Complete by:  As directed    Referring to [facility] Phase 2   Diagnosis:   Coronary Stents NSTEMI         Discharge Medications   Allergies as of 11/27/2017   No Known Allergies      Medication List     TAKE these medications    aspirin 81 MG EC tablet Take 1 tablet (81 mg total) by mouth daily. Start taking on:  11/28/2017   atorvastatin 80 MG tablet Commonly known as:  LIPITOR Take 1 tablet (80 mg total) by mouth daily at 6 PM.   CENTRUM SILVER 50+MEN Tabs Take 1 tablet by mouth daily.   fenofibrate 160 MG tablet Take 1 tablet (160 mg total) by mouth daily. Start taking on:  11/28/2017 What changed:   medication strength how much to take   hydrochlorothiazide 25 MG tablet Commonly known as:  HYDRODIURIL Take 25 mg by mouth daily.   loratadine 10 MG tablet Commonly known as:  CLARITIN Take 10 mg by mouth daily.   metFORMIN 1000 MG tablet Commonly known as:  GLUCOPHAGE Take 1,000 mg by mouth 2 (two) times daily with a meal.   metoprolol tartrate 50 MG tablet Commonly known as:  LOPRESSOR Take 1 tablet (50 mg total) by mouth 2 (two) times daily.  nitroGLYCERIN 0.4 MG SL  tablet Commonly known as:  NITROSTAT Place 1 tablet (0.4 mg total) under the tongue every 5 (five) minutes x 3 doses as needed for chest pain.   pantoprazole 40 MG tablet Commonly known as:  PROTONIX Take 1 tablet (40 mg total) by mouth daily. Start taking on:  11/28/2017   ticagrelor 90 MG Tabs tablet Commonly known as:  BRILINTA Take 1 tablet (90 mg total) by mouth 2 (two) times daily.   vitamin E 100 UNIT capsule Take 100 Units by mouth daily.         Aspirin prescribed at discharge?  Yes High Intensity Statin Prescribed? (Lipitor 40-80mg  or Crestor 20-40mg ): Yes Beta Blocker Prescribed? Yes For EF <40%, was ACEI/ARB Prescribed? No: EF normal ADP Receptor Inhibitor Prescribed? (i.e. Plavix etc.-Includes Medically Managed Patients): Yes For EF <40%, Aldosterone Inhibitor Prescribed? No: EF normal Was EF assessed during THIS hospitalization? Yes Was Cardiac Rehab II ordered? (Included Medically managed Patients): Yes   Outstanding Labs/Studies   FLP and LFT in 6-8 weeks  Duration of Discharge Encounter   Greater than 30 minutes including physician time.  SignedAzalee Course NP 11/27/2017, 3:23 PM  Personally seen and examined. Agree with above.  Feels well, no SOB, no CP. Had mild SOB post procedure, ?Brilinta. This morning feels well. Happy    GEN: No acute distress.   Neck: No JVD Cardiac: RRR, no murmurs, rubs, or gallops.  Respiratory: Clear to auscultation bilaterally. GI: Soft, nontender, non-distended  MS: No edema; No deformity. Cath site normal Neuro:  Nonfocal  Psych: Normal affect     Type 2 MI  - demand ischemia in the setting of SVT  - continue with DAPT, LAD DES. Watch for bleeding. If SOB occurs after Brilinta, consider change to Plavix after one month  - EF normal.   - Continue with metoprolol. Sinus brady noted at rest. Hopefully this will suppress SVT. If this occurs again, Dr. Lewayne Bunting EP for ablation (discussed case with him)   CAD   - 3v dz. As above. DES to LAD. Agressive mgt. Statin DAPT. Cardiac rehab.    ETOH abuse  - counsel on cessation.   DM with HTN  - diet controlled and Metformin. Ok to restart as outpt. Consider Jardiance in future.   - restart HCTZ.    Mixed hyperlipidemia  - atorva 80  - fenofibrate 145   Tobacco abuse  - cessation counsel    Ok for DC. TOC visit in 7 days with APP.

## 2017-11-27 NOTE — Discharge Instructions (Signed)
Hold metformin for 48 hours after cardiac catheterization. You may restart on metformin on 11/29/2017

## 2017-11-27 NOTE — Telephone Encounter (Signed)
New Message  TOC appoinment made on 12/12/2017 at 11:30 with Jeffery BoozerLaura Atkins per [facility]

## 2017-11-28 NOTE — Telephone Encounter (Signed)
Left message on phone to call us back for TCM call. Call #1.

## 2017-12-02 NOTE — Telephone Encounter (Signed)
Attempt #2    LMTCB

## 2017-12-07 NOTE — Progress Notes (Addendum)
Cardiology Office Note:    Date:  12/12/2017   ID:  Orange Hilligoss, DOB 04-Dec-1955, MRN [id number]  PCP:  Karle Plumber, MD  Cardiologist:  Donato Schultz, MD   Referring MD: Karle Plumber, MD   Chief Complaint  Patient presents with   Hospitalization Follow-up    CAD, SVT    History of Present Illness:    Jeffery Atkins is a 62 y.o. male with a hx of paroxysmal supraventricular tachycardia, heavy smoker, heavy alcohol use, and recent NSTEMI. He presented to [facility] after seeing his PCP for SVT, which was broken with adenosine. He also complained of left-sided chest pain. Elevated troponin possibly due to demand ischemia in the setting of SVT. He was ultimately taken to the cath lab. LHC 11/26/17 found small RCA, 50% proximal RCA, 60% proximal to mid RCA lesion, 90% mid RCA lesion, 90% proximal LAD lesion treated with 3.5 x 32 mm Synergy DES, 50% proximal left circumflex lesion. He was counseled on alcohol and smoking cessation. He was discharged on high dose statin and ASA/brilinta. He was also discharged on lopressor 50 mg BID.   NOTE: UNDER [facility] AND [facility] THERE IS ANOTHER PATIENT, NOT HIM.  IT TEAM AWARE. HE DOES NOT HAVE A HISTORY OF PRIOR CAD OR STENTS BEFORE THIS ADMISSION.  He presents today for his transition of care follow up appt. Since the heart cath, he has had no further episodes of palpitations, heart racing, or chest pain. He has been doing well on ASA and brilinta without bleeding problems. He describes shortness of breath when he squats down to do yard work. He denies SOB and chest pain when he walks 3 miles daily. He also mowed the yard yesterday without problems. He describes muscle cramps in the morning and feet numbness and tingling in the afternoon.   Upon review of his hospital imaging, his CXR was read with PNA. He was not ill-appearing in the hospital, was afebrile and without leukocytosis. It does not appear he was treated for PNA, but is now having episodes  of shortness of breath.   I encouraged walking program and participation in cardiac rehab.    Past Medical History:  Diagnosis Date   Anxiety    CAD (coronary atherosclerotic disease) 11/25/2017   GERD (gastroesophageal reflux disease)    High cholesterol    Hypertension    Type II diabetes mellitus (HCC)     Past Surgical History:  Procedure Laterality Date   CORONARY ANGIOPLASTY WITH STENT PLACEMENT  11/26/2017   CORONARY STENT INTERVENTION N/A 11/26/2017   Procedure: CORONARY STENT INTERVENTION;  Surgeon: Kathleene Hazel, MD;  Location: [facility] INVASIVE CV LAB;  Service: Cardiovascular;  Laterality: N/A;   LEFT HEART CATH AND CORONARY ANGIOGRAPHY N/A 11/26/2017   Procedure: LEFT HEART CATH AND CORONARY ANGIOGRAPHY;  Surgeon: Kathleene Hazel, MD;  Location: [facility] INVASIVE CV LAB;  Service: Cardiovascular;  Laterality: N/A;    Current Medications: Current Meds  Medication Sig   aspirin EC 81 MG EC tablet Take 1 tablet (81 mg total) by mouth daily.   atorvastatin (LIPITOR) 80 MG tablet Take 1 tablet (80 mg total) by mouth daily at 6 PM.   fenofibrate (TRICOR) 145 MG tablet Take 145 mg by mouth daily.   hydrochlorothiazide (HYDRODIURIL) 25 MG tablet Take 25 mg by mouth daily.   loratadine (CLARITIN) 10 MG tablet Take 10 mg by mouth daily.   metFORMIN (GLUCOPHAGE) 1000 MG tablet Take 1,000 mg by mouth 2 (two) times  daily with a meal.   metoprolol tartrate (LOPRESSOR) 50 MG tablet Take 1 tablet (50 mg total) by mouth 2 (two) times daily.   Multiple Vitamins-Minerals (CENTRUM SILVER 50+MEN) TABS Take 1 tablet by mouth daily.   nitroGLYCERIN (NITROSTAT) 0.4 MG SL tablet Place 1 tablet (0.4 mg total) under the tongue every 5 (five) minutes x 3 doses as needed for chest pain.   pantoprazole (PROTONIX) 40 MG tablet Take 1 tablet (40 mg total) by mouth daily.   ticagrelor (BRILINTA) 90 MG TABS tablet Take 1 tablet (90 mg total) by mouth 2 (two) times daily.   vitamin E 100 UNIT  capsule Take 100 Units by mouth daily.     Allergies:   Patient has no known allergies.   Social History   Socioeconomic History   Marital status: Married    Spouse name: Not on file   Number of children: Not on file   Years of education: Not on file   Highest education level: Not on file  Occupational History   Not on file  Social Needs   Financial resource strain: Not on file   Food insecurity:    Worry: Not on file    Inability: Not on file   Transportation needs:    Medical: Not on file    Non-medical: Not on file  Tobacco Use   Smoking status: Current Every Day Smoker    Packs/day: 1.50    Years: 46.00    Pack years: 69.00    Types: Cigarettes   Smokeless tobacco: Never Used  Substance and Sexual Activity   Alcohol use: Yes    Alcohol/week: 14.4 oz    Types: 12 Cans of beer, 12 Shots of liquor per week    Comment: 11/26/2017 "I drink ~ 4 days per week"   Drug use: Never   Sexual activity: Not on file  Lifestyle   Physical activity:    Days per week: Not on file    Minutes per session: Not on file   Stress: Not on file  Relationships   Social connections:    Talks on phone: Not on file    Gets together: Not on file    Attends religious service: Not on file    Active member of club or organization: Not on file    Attends meetings of clubs or organizations: Not on file    Relationship status: Not on file  Other Topics Concern   Not on file  Social History Narrative   Not on file     Family History: The patient's family history includes Hypertension in his father.  ROS:   Please see the history of present illness.    All other systems reviewed and are negative.  EKGs/Labs/Other Studies Reviewed:    The following studies were reviewed today:  Cath 11/26/2017 Conclusion      Prox RCA lesion is 50% stenosed. Prox RCA to Mid RCA lesion is 60% stenosed. Mid RCA lesion is 90% stenosed. Prox Cx lesion is 50% stenosed. Ost 1st Mrg lesion is 50%  stenosed. Ost LAD to Prox LAD lesion is 30% stenosed. Prox LAD-1 lesion is 90% stenosed. Prox LAD-2 lesion is 40% stenosed. Mid LAD lesion is 40% stenosed. A drug-eluting stent was successfully placed using a STENT SYNERGY DES 3.5X32. Post intervention, there is a 0% residual stenosis. Post intervention, there is a 0% residual stenosis. Post intervention, there is a 0% residual stenosis. The left ventricular systolic function is normal. LV end diastolic pressure  is normal. The left ventricular ejection fraction is 55-65% by visual estimate. There is no mitral valve regurgitation.   1. Triple vessel CAD 2. Severe stenosis mid LAD. Successful placement DES x 1 mid LAD 3. Moderate stenosis in the mid to distal LAD and in the proximal Circumflex and the first OM branch.  4. Small non-dominant RCA with severe mid stenosis 5. Normal LV systolic function   Recommendations: Will plan one year of DAPT with ASA/Brilinta. Continue beta blocker. Start high dose statin.     Echo 11/27/2017 LV EF: 60% -   65%   Study Conclusions - Left ventricle: The cavity size was normal. Wall thickness was   normal. Systolic function was normal. The estimated ejection   fraction was in the range of 60% to 65%. Wall motion was normal;   there were no regional wall motion abnormalities. Left   ventricular diastolic function parameters were normal. - Aortic valve: There is a calcified nodule on the noncoronary   cusp. There was no stenosis. - Mitral valve: Mildly calcified annulus. Mildly calcified leaflets   . There was trivial regurgitation. - Right ventricle: The cavity size was normal. Systolic function   was normal. - Tricuspid valve: Peak RV-RA gradient (S): 27 mm Hg. - Pulmonary arteries: PA peak pressure: 30 mm Hg (S). - Inferior vena cava: The vessel was normal in size. The   respirophasic diameter changes were in the normal range (= 50%),   consistent with normal central venous pressure.     Impressions: - Normal LV size and systolic function, EF 60-65%. Normal diastolic   function. Normal RV size and systolic function. No significant   valvular abnormalities. _____________   EKG:  EKG is ordered today.  The ekg ordered today demonstrates sinus bradycardia.  Recent Labs: 11/25/2017: ALT 21; Magnesium 1.9; TSH 0.732 11/27/2017: BUN 14; Creatinine, Ser 1.06; Hemoglobin 13.1; Platelets 210; Potassium 3.8; Sodium 136  Recent Lipid Panel    Component Value Date/Time   CHOL 177 11/26/2017 0240   TRIG 639 (H) 11/26/2017 0240   HDL 35 (L) 11/26/2017 0240   CHOLHDL 5.1 11/26/2017 0240   VLDL UNABLE TO CALCULATE IF TRIGLYCERIDE OVER 400 mg/dL 16/06/9603 5409   LDLCALC UNABLE TO CALCULATE IF TRIGLYCERIDE OVER 400 mg/dL 81/19/1478 2956    Physical Exam:    VS:  BP 122/74   Pulse (!) 54   Ht 5\' 5"  (1.651 m)   Wt 150 lb (68 kg)   SpO2 97%   BMI 24.96 kg/m     Wt Readings from Last 3 Encounters:  12/12/17 150 lb (68 kg)  11/27/17 143 lb 4.8 oz (65 kg)     GEN: Well nourished, well developed in no acute distress HEENT: Normal NECK: No JVD; No carotid bruits LYMPHATICS: No lymphadenopathy CARDIAC: RRR, no murmurs, rubs, gallops RESPIRATORY:  Clear to auscultation without rales, wheezing or rhonchi  ABDOMEN: Soft, non-tender, non-distended MUSCULOSKELETAL:  No edema; No deformity  SKIN: Warm and dry, right radial access site, C/D/I NEUROLOGIC:  Alert and oriented x 3 PSYCHIATRIC:  Normal affect   ASSESSMENT:    1. Atherosclerosis of native coronary artery of native heart, angina presence unspecified   2. Non-ST elevation (NSTEMI) myocardial infarction (HCC)   3. Status post insertion of drug-eluting stent into left anterior descending (LAD) artery   4. SVT (supraventricular tachycardia) (HCC)   5. Essential hypertension   6. High cholesterol   7. SOB (shortness of breath)   8. Muscle cramps  PLAN:    In order of problems listed above:  Atherosclerosis of  native coronary artery of native heart, angina presence unspecified Non-ST elevation (NSTEMI) myocardial infarction [facility](HCC) - Status post insertion of drug-eluting stent into left anterior descending (LAD) artery Pt has done well post-cath. Discussed possible side effect of brilinta contributing to his shortness of breath. Discussed taking with caffeine. Continue ASA, brilinta, lopressor.  SVT (supraventricular tachycardia) (HCC)  He has had a total of three episodes of heart racing over the past 10 years, including this most recent episode. We discussed vagal maneuvers and carotid massage (which worked in the past). If this becomes more frequent or troublesome, we will refer to EP.  Essential hypertension  Pressures well-controlled. No medication changes. Continue HCTZ (took prior to cath).   High cholesterol  11/26/2017: Cholesterol 177; HDL 35; LDL Cholesterol UNABLE TO CALCULATE IF TRIGLYCERIDE OVER 400 mg/dL; Triglycerides 639; VLDL UNABLE TO CALCULATE IF TRIGLYCERIDE OVER 400 mg/dL Started on high dose lipitor in  Hospital along with fenofibrate. Complaints of muscle cramps. If electrolytes are normal, may switch to crestor. Will need repeat FLP and LFTs in 6 weeks.   SOB (shortness of breath)  He describes atypical shortness of breath when he bends down/squats down to do yard work when wearing tight pants. We discussed side effects of brilinta and using caffeine to reduce this side effect. However, in review of his CXR in the ED, it was read as possible right-sided PNA. It does not appear that he was treated for this. He was afebrile and without leukocytosis in the hospital. Will repeat a CXR today to see if it still concerning for PNA.   Muscle cramps Will check BMP and Magnesium. If these are normal, will consider switching lipitor to crestor.    Medication Adjustments/Labs and Tests Ordered: Current medicines are reviewed at length with the patient today.  Concerns regarding medicines  are outlined above.  Orders Placed This Encounter  Procedures   DG Chest 2 View   Basic metabolic panel   CBC   Magnesium   Lipid panel   Hepatic function panel   EKG 12-Lead     Signed, Marcelino Dusterngela Nicole Yvon Mccord, [place]  12/12/2017 12:24 PM    [facility]

## 2017-12-12 ENCOUNTER — Ambulatory Visit
Admission: RE | Admit: 2017-12-12 | Discharge: 2017-12-12 | Disposition: A | Discharge status: Home / Self Care | Payer: Self-pay | Source: Ambulatory Visit | Attending: Physician Assistant | Admitting: Physician Assistant

## 2017-12-12 ENCOUNTER — Ambulatory Visit: Payer: Self-pay | Admitting: Physician Assistant

## 2017-12-12 VITALS — BP 122/74 | HR 54 | Ht 65.0 in | Wt 150.0 lb

## 2017-12-12 DIAGNOSIS — E7800 Pure hypercholesterolemia, unspecified: Secondary | ICD-10-CM

## 2017-12-12 DIAGNOSIS — R0602 Shortness of breath: Secondary | ICD-10-CM

## 2017-12-12 DIAGNOSIS — Z955 Presence of coronary angioplasty implant and graft: Secondary | ICD-10-CM

## 2017-12-12 DIAGNOSIS — I10 Essential (primary) hypertension: Secondary | ICD-10-CM

## 2017-12-12 DIAGNOSIS — I2510 Atherosclerotic heart disease of native coronary artery without angina pectoris: Secondary | ICD-10-CM

## 2017-12-12 DIAGNOSIS — I471 Supraventricular tachycardia: Secondary | ICD-10-CM

## 2017-12-12 DIAGNOSIS — I214 Non-ST elevation (NSTEMI) myocardial infarction: Secondary | ICD-10-CM

## 2017-12-12 DIAGNOSIS — R252 Cramp and spasm: Secondary | ICD-10-CM

## 2017-12-12 NOTE — Addendum Note (Signed)
Addended by: Duane BostonUKE, ANGELA N on: 12/12/2017 02:18 PM   Modules accepted: Level of Service

## 2017-12-12 NOTE — Patient Instructions (Addendum)
Medication Instructions:  Your physician recommends that you continue on your current medications as directed. Please refer to the Current Medication list given to you today.   Labwork: TODAY:  BMET, CBC, & MAGNESIUM  6 WEEKS: FASTING LIPID & LIVER : MAKE SURE YOU COME FASTING (NOTHING TO EAT OR DRINK AFTER MIDNIGHT THE NIGHT BEFORE)  Testing/Procedures: A chest x-ray takes a picture of the organs and structures inside the chest, including the heart, lungs, and blood vessels. This test can show several things, including, whether the heart is enlarges; whether fluid is building up in the lungs; and whether pacemaker / defibrillator leads are still in place. [facility] [address] [phone]  Follow-Up: Your physician recommends that you schedule a follow-up appointment in: 3 MONTHS WITH DR. Anne FuSKAINS   Any Other Special Instructions Will Be Listed Below (If Applicable).     If you need a refill on your cardiac medications before your next appointment, please call your pharmacy.

## 2017-12-13 LAB — BASIC METABOLIC PANEL
BUN/Creatinine Ratio: 16 (ref 10–24)
BUN: 17 mg/dL (ref 8–27)
CO2: 25 mmol/L (ref 20–29)
Calcium: 10.4 mg/dL — ABNORMAL HIGH (ref 8.6–10.2)
Chloride: 97 mmol/L (ref 96–106)
Creatinine, Ser: 1.04 mg/dL (ref 0.76–1.27)
GFR calc Af Amer: 90 mL/min/{1.73_m2} (ref >59)
GFR calc non Af Amer: 78 mL/min/{1.73_m2} (ref >59)
Glucose: 115 mg/dL — ABNORMAL HIGH (ref 65–99)
Potassium: 4.9 mmol/L (ref 3.5–5.2)
Sodium: 137 mmol/L (ref 134–144)

## 2017-12-13 LAB — CBC
Hematocrit: 41.8 % (ref 37.5–51.0)
Hemoglobin: 14 g/dL (ref 13.0–17.7)
MCH: 31.8 pg (ref 26.6–33.0)
MCHC: 33.5 g/dL (ref 31.5–35.7)
MCV: 95 fL (ref 79–97)
Platelets: 286 10*3/uL (ref 150–379)
RBC: 4.4 x10E6/uL (ref 4.14–5.80)
RDW: 12.6 % (ref 12.3–15.4)
WBC: 7.6 10*3/uL (ref 3.4–10.8)

## 2017-12-13 LAB — MAGNESIUM: Magnesium: 1.7 mg/dL (ref 1.6–2.3)

## 2017-12-24 ENCOUNTER — Other Ambulatory Visit: Payer: Self-pay | Admitting: Physician Assistant

## 2017-12-25 NOTE — Telephone Encounter (Signed)
Refill Request.  

## 2017-12-25 NOTE — Telephone Encounter (Signed)
Outpatient Medication Detail    Disp Refills Start End   ticagrelor (BRILINTA) 90 MG TABS tablet 180 tablet 3 11/27/2017    Sig - Route: Take 1 tablet (90 mg total) by mouth 2 (two) times daily. - Oral   Sent to pharmacy as: ticagrelor (BRILINTA) 90 MG Tab tablet   E-Prescribing Status: Receipt confirmed by pharmacy (11/27/2017 10:23 AM EDT)   [id number] [place] [place] [facility] [address] [facility] [address]

## 2017-12-30 ENCOUNTER — Other Ambulatory Visit: Payer: Self-pay | Admitting: Physician Assistant

## 2017-12-30 ENCOUNTER — Telehealth: Payer: Self-pay | Admitting: Cardiology

## 2017-12-30 MED ORDER — TICAGRELOR 90 MG PO TABS: 90.0000 mg | ORAL_TABLET | Freq: Two times a day (BID) | ORAL | 3 refills | Status: DC

## 2017-12-30 NOTE — Telephone Encounter (Signed)
Refills for Brilinta was electronically resent to the pts confirmed pharmacy of choice, as he should still be taking this.  Pharmacy at [facility] stating that the pt had no further refills left, even though this was clearly written and sent for the pt on 11/27/17, post-stent and discharge from the hospital. New order resent under pts Primary Cardiologist, Dr Anne Fu.  Pharmacist at [facility] received this and will fill this for the pt now.

## 2017-12-30 NOTE — Telephone Encounter (Signed)
°*  STAT* If patient is at the pharmacy, call can be transferred to refill team.   1. Which medications need to be refilled? (please list name of each medication and dose if known)Pharmacist calling saying he needs a new prescription for Brilinta-He keeps getting a denial from this office-he says they never received a request for the Brilinta  2. Which pharmacy/location (including street and city if local pharmacy) is medication to be sent to?[phone]  3. Do they need a 30 day or 90 day supply? 180 and refills

## 2017-12-30 NOTE — Telephone Encounter (Signed)
Pt's daughter is calling  Pharmacy won't give her the refill for Birlinta  Because they said that they already got 90 day supply. Daughter need to speak to nurse because pt doesn't have any medication left.

## 2017-12-30 NOTE — Telephone Encounter (Signed)
REFILL 

## 2018-01-23 ENCOUNTER — Other Ambulatory Visit: Payer: Self-pay

## 2018-03-12 ENCOUNTER — Telehealth: Payer: Self-pay | Admitting: Cardiology

## 2018-03-12 NOTE — Telephone Encounter (Signed)
New message   Per Anleeb the pt daughter states that she wants have father to have blood work done before his next visit with Dr. Anne FuSkains .

## 2018-03-12 NOTE — Telephone Encounter (Signed)
Appointment made for pt to have his fasting lipids and lft's on 7/11. Order was placed by Micah FlesherAngela Duke.

## 2018-03-13 ENCOUNTER — Other Ambulatory Visit: Payer: Self-pay

## 2018-03-13 DIAGNOSIS — I471 Supraventricular tachycardia, unspecified: Secondary | ICD-10-CM

## 2018-03-13 DIAGNOSIS — Z955 Presence of coronary angioplasty implant and graft: Secondary | ICD-10-CM

## 2018-03-13 DIAGNOSIS — E7800 Pure hypercholesterolemia, unspecified: Secondary | ICD-10-CM

## 2018-03-13 DIAGNOSIS — R0602 Shortness of breath: Secondary | ICD-10-CM

## 2018-03-13 DIAGNOSIS — R252 Cramp and spasm: Secondary | ICD-10-CM

## 2018-03-13 DIAGNOSIS — I214 Non-ST elevation (NSTEMI) myocardial infarction: Secondary | ICD-10-CM

## 2018-03-13 DIAGNOSIS — I10 Essential (primary) hypertension: Secondary | ICD-10-CM

## 2018-03-13 DIAGNOSIS — I2510 Atherosclerotic heart disease of native coronary artery without angina pectoris: Secondary | ICD-10-CM

## 2018-03-13 LAB — HEPATIC FUNCTION PANEL
ALT: 24 IU/L (ref 0–44)
AST: 36 IU/L (ref 0–40)
Albumin: 4.3 g/dL (ref 3.6–4.8)
Alkaline Phosphatase: 61 IU/L (ref 39–117)
Bilirubin Total: 0.5 mg/dL (ref 0.0–1.2)
Bilirubin, Direct: 0.18 mg/dL (ref 0.00–0.40)
Total Protein: 6.8 g/dL (ref 6.0–8.5)

## 2018-03-13 LAB — LIPID PANEL
Chol/HDL Ratio: 4.1 ratio (ref 0.0–5.0)
Cholesterol, Total: 106 mg/dL (ref 100–199)
HDL: 26 mg/dL — ABNORMAL LOW (ref >39)
LDL Calculated: 28 mg/dL (ref 0–99)
Triglycerides: 259 mg/dL — ABNORMAL HIGH (ref 0–149)
VLDL Cholesterol Cal: 52 mg/dL — ABNORMAL HIGH (ref 5–40)

## 2018-03-18 ENCOUNTER — Ambulatory Visit: Payer: Self-pay | Admitting: Cardiology

## 2018-03-18 VITALS — BP 122/66 | HR 56 | Ht 65.0 in | Wt 148.0 lb

## 2018-03-18 DIAGNOSIS — I471 Supraventricular tachycardia: Secondary | ICD-10-CM

## 2018-03-18 DIAGNOSIS — I2510 Atherosclerotic heart disease of native coronary artery without angina pectoris: Secondary | ICD-10-CM

## 2018-03-18 DIAGNOSIS — R252 Cramp and spasm: Secondary | ICD-10-CM

## 2018-03-18 DIAGNOSIS — E7800 Pure hypercholesterolemia, unspecified: Secondary | ICD-10-CM

## 2018-03-18 MED ORDER — NITROGLYCERIN 0.4 MG SL SUBL: 0.4000 mg | SUBLINGUAL_TABLET | SUBLINGUAL | 5 refills | Status: DC | PRN

## 2018-03-18 NOTE — Patient Instructions (Signed)
Medication Instructions:  No medication changes   Follow-Up: 6 month follow up with Nada BoozerLaura Ingold, NP  1 year follow up with Dr. Anne FuSkains.   If you need a refill on your cardiac medications before your next appointment, please call your pharmacy.

## 2018-03-18 NOTE — Progress Notes (Addendum)
Cardiology Office Note:    Date:  03/18/2018   ID:  Jeffery Atkins, DOB Sep 17, 1955, MRN [id number]  PCP:  Karle Plumber, MD  Cardiologist:  Donato Schultz, MD   Referring MD: Karle Plumber, MD     History of Present Illness:    Jeffery Atkins is a 62 y.o. male here for CAD follow-up.  Has a history of paroxysmal SVT, smoker, alcohol use and non-ST elevation myocardial infarction.  Prior SVT broken with adenosine.  Elevated troponin in the setting of SVT, taken to Cath Lab on 11/26/2017 where he was found to have significant CAD.  A 90% proximal LAD lesion was treated with a Synergy DES.  Other moderate severe disease.   NOTE: [facility] THERE IS ANOTHER PATIENT, NOT HIM.  IT TEAM AWARE. HE DOES NOT HAVE A HISTORY OF PRIOR CAD OR STENTS BEFORE THIS ADMISSION.   He has been doing quite well.  Fact he has cut back from 2 packs a day smoking down to 4 to 6 cigarettes a day.  He states now that he is ready to quit.  His job entails and walking quite vigorously and lifting.  No issues with this.  He has gardening.  Doing well,.  Occasionally when he gets back up from a crouched position he may feel some mild sensation in his mid chest but this is fleeting.  No fevers chills nausea vomiting syncope.  Taking his medications.  Sometimes he will have cramping in his calf and feet at night.  His 17 year old granddaughter also feels the sensation occasionally.  Past Medical History:  Diagnosis Date   Anxiety    CAD (coronary atherosclerotic disease) 11/25/2017   GERD (gastroesophageal reflux disease)    High cholesterol    Hypertension    Type II diabetes mellitus (HCC)     Past Surgical History:  Procedure Laterality Date   CORONARY ANGIOPLASTY WITH STENT PLACEMENT  11/26/2017   CORONARY STENT INTERVENTION N/A 11/26/2017   Procedure: CORONARY STENT INTERVENTION;  Surgeon: Kathleene Hazel, MD;  [facility]: [facility];  Service: Cardiovascular;  Laterality:  N/A;   LEFT HEART CATH AND CORONARY ANGIOGRAPHY N/A 11/26/2017   Procedure: LEFT HEART CATH AND CORONARY ANGIOGRAPHY;  Surgeon: Kathleene Hazel, MD;  Location: [facility];  Service: Cardiovascular;  Laterality: N/A;    Current Medications: Current Meds  Medication Sig   aspirin EC 81 MG EC tablet Take 1 tablet (81 mg total) by mouth daily.   atorvastatin (LIPITOR) 80 MG tablet Take 1 tablet (80 mg total) by mouth daily at 6 PM.   BRILINTA 90 MG TABS tablet TAKE 1 TABLET BY MOUTH TWICE DAILY   fenofibrate (TRICOR) 145 MG tablet Take 145 mg by mouth daily.   hydrochlorothiazide (HYDRODIURIL) 25 MG tablet Take 25 mg by mouth daily.   loratadine (CLARITIN) 10 MG tablet Take 10 mg by mouth daily.   metFORMIN (GLUCOPHAGE) 1000 MG tablet Take 1,000 mg by mouth 2 (two) times daily with a meal.   metoprolol tartrate (LOPRESSOR) 50 MG tablet Take 1 tablet (50 mg total) by mouth 2 (two) times daily.   Multiple Vitamins-Minerals (CENTRUM SILVER 50+MEN) TABS Take 1 tablet by mouth daily.   nitroGLYCERIN (NITROSTAT) 0.4 MG SL tablet Place 1 tablet (0.4 mg total) under the tongue every 5 (five) minutes x 3 doses as needed for chest pain.   pantoprazole (PROTONIX) 40 MG tablet Take 1 tablet (40 mg total) by mouth daily.  vitamin E 100 UNIT capsule Take 100 Units by mouth daily.   [DISCONTINUED] nitroGLYCERIN (NITROSTAT) 0.4 MG SL tablet Place 1 tablet (0.4 mg total) under the tongue every 5 (five) minutes x 3 doses as needed for chest pain.     Allergies:   Patient has no known allergies.   Social History   Socioeconomic History   Marital status: Married    Spouse name: Not on file   Number of children: Not on file   Years of education: Not on file   Highest education level: Not on file  Occupational History   Not on file  Social Needs   Financial resource strain: Not on file   Food insecurity:    Worry: Not on file    Inability: Not on file   Transportation needs:     Medical: Not on file    Non-medical: Not on file  Tobacco Use   Smoking status: Current Every Day Smoker    Packs/day: 1.50    Years: 46.00    Pack years: 69.00    Types: Cigarettes   Smokeless tobacco: Never Used  Substance and Sexual Activity   Alcohol use: Yes    Alcohol/week: 14.4 oz    Types: 12 Cans of beer, 12 Shots of liquor per week    Comment: 11/26/2017 "I drink ~ 4 days per week"   Drug use: Never   Sexual activity: Not on file  Lifestyle   Physical activity:    Days per week: Not on file    Minutes per session: Not on file   Stress: Not on file  Relationships   Social connections:    Talks on phone: Not on file    Gets together: Not on file    Attends religious service: Not on file    Active member of club or organization: Not on file    Attends meetings of clubs or organizations: Not on file    Relationship status: Not on file  Other Topics Concern   Not on file  Social History Narrative   Not on file     Family History: The patient's family history includes Hypertension in his father.  ROS:   Please see the history of present illness.     All other systems reviewed and are negative.  EKGs/Labs/Other Studies Reviewed:    The following studies were reviewed today:  Cath 11/26/2017 Conclusion      Prox RCA lesion is 50% stenosed. Prox RCA to Mid RCA lesion is 60% stenosed. Mid RCA lesion is 90% stenosed. Prox Cx lesion is 50% stenosed. Ost 1st Mrg lesion is 50% stenosed. Ost LAD to Prox LAD lesion is 30% stenosed. Prox LAD-1 lesion is 90% stenosed. Prox LAD-2 lesion is 40% stenosed. Mid LAD lesion is 40% stenosed. A drug-eluting stent was successfully placed using a STENT SYNERGY DES 3.5X32. Post intervention, there is a 0% residual stenosis. Post intervention, there is a 0% residual stenosis. Post intervention, there is a 0% residual stenosis. The left ventricular systolic function is normal. LV end diastolic pressure is normal. The left  ventricular ejection fraction is 55-65% by visual estimate. There is no mitral valve regurgitation.   1. Triple vessel CAD 2. Severe stenosis mid LAD. Successful placement DES x 1 mid LAD 3. Moderate stenosis in the mid to distal LAD and in the proximal Circumflex and the first OM branch.  4. Small non-dominant RCA with severe mid stenosis 5. Normal LV systolic function   Recommendations: Will plan  one year of DAPT with ASA/Brilinta. Continue beta blocker. Start high dose statin.       Echo 11/27/2017 LV EF: 60% -   65%   Study Conclusions - Left ventricle: The cavity size was normal. Wall thickness was   normal. Systolic function was normal. The estimated ejection   fraction was in the range of 60% to 65%. Wall motion was normal;   there were no regional wall motion abnormalities. Left   ventricular diastolic function parameters were normal. - Aortic valve: There is a calcified nodule on the noncoronary   cusp. There was no stenosis. - Mitral valve: Mildly calcified annulus. Mildly calcified leaflets   . There was trivial regurgitation. - Right ventricle: The cavity size was normal. Systolic function   was normal. - Tricuspid valve: Peak RV-RA gradient (S): 27 mm Hg. - Pulmonary arteries: PA peak pressure: 30 mm Hg (S). - Inferior vena cava: The vessel was normal in size. The   respirophasic diameter changes were in the normal range (= 50%),   consistent with normal central venous pressure.   Impressions: - Normal LV size and systolic function, EF 60-65%. Normal diastolic   function. Normal RV size and systolic function. No significant   valvular abnormalities. _____________            EKG: Sinus bradycardia no other abnormalities personally viewed  Recent Labs: 11/25/2017: TSH 0.732 12/12/2017: BUN 17; Creatinine, Ser 1.04; Hemoglobin 14.0; Magnesium 1.7; Platelets 286; Potassium 4.9; Sodium 137 03/13/2018: ALT 24  Recent Lipid Panel    Component Value Date/Time    CHOL 106 03/13/2018 0844   TRIG 259 (H) 03/13/2018 0844   HDL 26 (L) 03/13/2018 0844   CHOLHDL 4.1 03/13/2018 0844   CHOLHDL 5.1 11/26/2017 0240   VLDL UNABLE TO CALCULATE IF TRIGLYCERIDE OVER 400 mg/dL 96/04/540903/26/2019 81190240   LDLCALC 28 03/13/2018 0844    Physical Exam:    VS:  BP 122/66   Pulse (!) 56   Ht 5\' 5"  (1.651 m)   Wt 148 lb (67.1 kg)   SpO2 97%   BMI 24.63 kg/m     Wt Readings from Last 3 Encounters:  03/18/18 148 lb (67.1 kg)  12/12/17 150 lb (68 kg)  11/27/17 143 lb 4.8 oz (65 kg)     GEN:  Well nourished, well developed in no acute distress HEENT: Normal NECK: No JVD; No carotid bruits LYMPHATICS: No lymphadenopathy CARDIAC: RRR, no murmurs, rubs, gallops RESPIRATORY:  Clear to auscultation without rales, wheezing or rhonchi  ABDOMEN: Soft, non-tender, non-distended MUSCULOSKELETAL:  No edema; No deformity  SKIN: Warm and dry NEUROLOGIC:  Alert and oriented x 3 PSYCHIATRIC:  Normal affect   ASSESSMENT:    1. Atherosclerosis of native coronary artery of native heart, angina presence unspecified   2. SVT (supraventricular tachycardia) (HCC)   3. High cholesterol   4. Muscle cramps    PLAN:    In order of problems listed above:  Coronary artery disease post MI - Doing well.  No anginal symptoms.  [facility].  On aspirin, Brilinta.  Continue.  LAD stent.  SVT - 3 prior episodes over 10 years.  Vagal maneuvers carotid massage worked in the past.  Continue with metoprolol at this point.  If this worsens, EP.  Essential hypertension - On multidrug regimen.  No changes made.  He is having occasional cramping at night.  I spoke that HCTZ sometimes can be a culprit for this.  He had this for years however.  Must continue to monitor.  Encourage stretching.  Adequate hydration.  Tobacco use -Discussed tobacco cessation for 6 minutes.  He is cut back from 2packs to 6 cigarettes daily.  With his granddaughter in the  room, he states that he is now ready to quit.  Hyperlipidemia - Continue with atorvastatin.  Last LDL 28.  Excellent.  6 months Vernona Rieger, 1 year me  Medication Adjustments/Labs and Tests Ordered: Current medicines are reviewed at length with the patient today.  Concerns regarding medicines are outlined above.  No orders of the defined types were placed in this encounter.  Meds ordered this encounter  Medications   nitroGLYCERIN (NITROSTAT) 0.4 MG SL tablet    Sig: Place 1 tablet (0.4 mg total) under the tongue every 5 (five) minutes x 3 doses as needed for chest pain.    Dispense:  25 tablet    Refill:  5    Patient Instructions  Medication Instructions:  No medication changes   Follow-Up: 6 month follow up with Nada Boozer, NP  1 year follow up with Dr. Anne Fu.   If you need a refill on your cardiac medications before your next appointment, please call your pharmacy.      Signed, Donato Schultz, MD  03/18/2018 11:48 AM    [facility]

## 2018-03-20 ENCOUNTER — Telehealth: Payer: Self-pay | Admitting: Physician Assistant

## 2018-03-20 NOTE — Telephone Encounter (Signed)
Left message today and yesterday for patient/daughter to call and make lipid appt with pharmacist.

## 2018-03-27 ENCOUNTER — Telehealth: Payer: Self-pay | Admitting: Cardiology

## 2018-03-27 NOTE — Telephone Encounter (Signed)
Dr. Theresia LoPitts, DDS office calling wanting to know if pt ok to have teeth cleaned today since having heart attack in March.  Advised ok to proceed with teeth cleaning and exam.  They asked that we fax over this information to [phone].  Information faxed.

## 2018-04-09 ENCOUNTER — Ambulatory Visit: Payer: Self-pay | Admitting: Physician Assistant

## 2018-05-20 ENCOUNTER — Other Ambulatory Visit: Payer: Self-pay | Admitting: Physician Assistant

## 2018-09-29 ENCOUNTER — Ambulatory Visit: Payer: Self-pay | Admitting: Nurse Practitioner

## 2018-09-29 NOTE — Progress Notes (Deleted)
CARDIOLOGY OFFICE NOTE  Date:  09/29/2018    Jeffery Atkins Date of Birth: 28-Oct-1955 Medical Record #[id number]  PCP:  Karle Plumber, MD  Cardiologist:  Anne Fu    No chief complaint on file.   History of Present Illness: Jeffery Atkins is a 63 y.o. male who presents today for a 6 month check. Seen for Dr. Anne Fu.   He has a history of CAD with prior NSTEMI in March of 2019, PSVT (broken with Adenosine), tobacco abuse, & alcohol use.    He had an elevated troponin in the setting of SVT, taken to Cath Lab on 11/26/2017 where he was found to have significant CAD.  A 90% proximal LAD lesion was treated with a Synergy DES.  Other moderate severe disease noted that is managed medically.   NOTE FROM PRIOR RECORD: [facility] THERE IS ANOTHER PATIENT, NOT HIM. IT TEAM AWARE. HE DOES NOT HAVE A HISTORY OF PRIORCAD OR STENTS BEFORE THIS ADMISSION.  Last seen here back in July by Dr. Anne Fu - was felt to be doing well. Had tapered down his smoking. Was exercising.   Comes in today. Here with   Past Medical History:  Diagnosis Date  . Anxiety   . CAD (coronary atherosclerotic disease) 11/25/2017  . GERD (gastroesophageal reflux disease)   . High cholesterol   . Hypertension   . Type II diabetes mellitus (HCC)     Past Surgical History:  Procedure Laterality Date  . CORONARY ANGIOPLASTY WITH STENT PLACEMENT  11/26/2017  . CORONARY STENT INTERVENTION N/A 11/26/2017   Procedure: CORONARY STENT INTERVENTION;  Surgeon: Kathleene Hazel, MD;  Location: [facility];  Service: Cardiovascular;  Laterality: N/A;  . LEFT HEART CATH AND CORONARY ANGIOGRAPHY N/A 11/26/2017   Procedure: LEFT HEART CATH AND CORONARY ANGIOGRAPHY;  Surgeon: Kathleene Hazel, MD;  Location: [facility];  Service: Cardiovascular;  Laterality: N/A;     Medications: No outpatient medications have been marked as taking for the 09/29/18 encounter (Appointment) with  Rosalio Macadamia, NP.     Allergies: No Known Allergies  Social History: The patient  reports that he has been smoking cigarettes. He has a 69.00 pack-year smoking history. He has never used smokeless tobacco. He reports current alcohol use of about 24.0 standard drinks of alcohol per week. He reports that he does not use drugs.   Family History: The patient's family history includes Hypertension in his father.   Review of Systems: Please see the history of present illness.   Otherwise, the review of systems is positive for none.   All other systems are reviewed and negative.   Physical Exam: VS:  There were no vitals taken for this visit. Marland Kitchen  BMI There is no height or weight on file to calculate BMI.  Wt Readings from Last 3 Encounters:  03/18/18 148 lb (67.1 kg)  12/12/17 150 lb (68 kg)  11/27/17 143 lb 4.8 oz (65 kg)    General: Pleasant. Well developed, well nourished and in no acute distress.   HEENT: Normal.  Neck: Supple, no JVD, carotid bruits, or masses noted.  Cardiac: Regular rate and rhythm. No murmurs, rubs, or gallops. No edema.  Respiratory:  Lungs are clear to auscultation bilaterally with normal work of breathing.  GI: Soft and nontender.  MS: No deformity or atrophy. Gait and ROM intact.  Skin: Warm and dry. Color is normal.  Neuro:  Strength and sensation are intact and  no gross focal deficits noted.  Psych: Alert, appropriate and with normal affect.   LABORATORY DATA:  EKG:  EKG is ordered today. This demonstrates .  Lab Results  Component Value Date   WBC 7.6 12/12/2017   HGB 14.0 12/12/2017   HCT 41.8 12/12/2017   PLT 286 12/12/2017   GLUCOSE 115 (H) 12/12/2017   CHOL 106 03/13/2018   TRIG 259 (H) 03/13/2018   HDL 26 (L) 03/13/2018   LDLCALC 28 03/13/2018   ALT 24 03/13/2018   AST 36 03/13/2018   NA 137 12/12/2017   K 4.9 12/12/2017   CL 97 12/12/2017   CREATININE 1.04 12/12/2017   BUN 17 12/12/2017   CO2 25 12/12/2017   TSH 0.732  11/25/2017   INR 1.21 11/25/2017   HGBA1C 6.3 (H) 11/25/2017     BNP (last 3 results) No results for input(s): BNP in the last 8760 hours.  ProBNP (last 3 results) No results for input(s): PROBNP in the last 8760 hours.   Other Studies Reviewed Today:  Cath 11/26/2017 Conclusion     Prox RCA lesion is 50% stenosed.  Prox RCA to Mid RCA lesion is 60% stenosed.  Mid RCA lesion is 90% stenosed.  Prox Cx lesion is 50% stenosed.  Ost 1st Mrg lesion is 50% stenosed.  Ost LAD to Prox LAD lesion is 30% stenosed.  Prox LAD-1 lesion is 90% stenosed.  Prox LAD-2 lesion is 40% stenosed.  Mid LAD lesion is 40% stenosed.  A drug-eluting stent was successfully placed using a STENT SYNERGY DES 3.5X32.  Post intervention, there is a 0% residual stenosis.  Post intervention, there is a 0% residual stenosis.  Post intervention, there is a 0% residual stenosis.  The left ventricular systolic function is normal.  LV end diastolic pressure is normal.  The left ventricular ejection fraction is 55-65% by visual estimate.  There is no mitral valve regurgitation.  1. Triple vessel CAD 2. Severe stenosis mid LAD. Successful placement DES x 1 mid LAD 3. Moderate stenosis in the mid to distal LAD and in the proximal Circumflex and the first OM branch.  4. Small non-dominant RCA with severe mid stenosis 5. Normal LV systolic function  Recommendations: Will plan one year of DAPT with ASA/Brilinta. Continue beta blocker. Start high dose statin.    Echo 11/27/2017 LV EF: 60% - 65%  Study Conclusions - Left ventricle: The cavity size was normal. Wall thickness was normal. Systolic function was normal. The estimated ejection fraction was in the range of 60% to 65%. Wall motion was normal; there were no regional wall motion abnormalities. Left ventricular diastolic function parameters were normal. - Aortic valve: There is a calcified nodule on the  noncoronary cusp. There was no stenosis. - Mitral valve: Mildly calcified annulus. Mildly calcified leaflets . There was trivial regurgitation. - Right ventricle: The cavity size was normal. Systolic function was normal. - Tricuspid valve: Peak RV-RA gradient (S): 27 mm Hg. - Pulmonary arteries: PA peak pressure: 30 mm Hg (S). - Inferior vena cava: The vessel was normal in size. The respirophasic diameter changes were in the normal range (= 50%), consistent with normal central venous pressure.  Impressions: - Normal LV size and systolic function, EF 60-65%. Normal diastolic function. Normal RV size and systolic function. No significant valvular abnormalities. _____________    Assessment/Plan:  1. CAD with prior NSTEMI in the setting of PSVT  2. PSVT  3. HLD  4. Multi-substance use  5. HTN -  Coronary artery disease post MI - Doing well.  No anginal symptoms.  [facility].  On aspirin, Brilinta.  Continue.  LAD stent.  SVT - 3 prior episodes over 10 years.  Vagal maneuvers carotid massage worked in the past.  Continue with metoprolol at this point.  If this worsens, EP.  Essential hypertension - On multidrug regimen.  No changes made.  He is having occasional cramping at night.  I spoke that HCTZ sometimes can be a culprit for this.  He had this for years however.  Must continue to monitor.  Encourage stretching.  Adequate hydration.  Tobacco use -Discussed tobacco cessation for 6 minutes.  He is cut back from 2packs to 6 cigarettes daily.  With his granddaughter in the room, he states that he is now ready to quit.  Hyperlipidemia - Continue with atorvastatin.  Last LDL 28.  Excellent.  Current medicines are reviewed with the patient today.  The patient does not have concerns regarding medicines other than what has been noted above.  The following changes have been made:  See above.  Labs/ tests  ordered today include:   No orders of the defined types were placed in this encounter.    Disposition:   FU with Dr.Skains in 6 months.    Patient is agreeable to this plan and will call if any problems develop in the interim.   SignedNorma Fredrickson: Shakiah Wester, NP  09/29/2018 7:53 AM  [facility] [address] [place], [place]  [address] Phone: [phone] Fax: [phone]

## 2018-12-20 ENCOUNTER — Other Ambulatory Visit: Payer: Self-pay | Admitting: Physician Assistant

## 2018-12-22 NOTE — Telephone Encounter (Signed)
Lopressor 50 mg refilled. 

## 2019-03-10 ENCOUNTER — Other Ambulatory Visit: Payer: Self-pay | Admitting: Physician Assistant

## 2019-04-17 ENCOUNTER — Telehealth: Payer: Self-pay

## 2019-04-17 NOTE — Telephone Encounter (Signed)
Needs follow up appointment. No answer when called. 

## 2019-06-22 ENCOUNTER — Other Ambulatory Visit: Payer: Self-pay | Admitting: Physician Assistant

## 2019-06-24 ENCOUNTER — Telehealth: Payer: Self-pay

## 2019-06-24 NOTE — Telephone Encounter (Signed)
Called pt to set up virtual visit with APP, left message asking pt to call the office.

## 2019-06-24 NOTE — Telephone Encounter (Signed)
Virtual Visit Pre-Appointment Phone Call  "(Name), I am calling you today to discuss your upcoming appointment. We are currently trying to limit exposure to the virus that causes COVID-19 by seeing patients at home rather than in the office."  1. "What is the BEST phone number to call the day of the visit?" - include this in appointment notes  2. "Do you have or have access to (through a family member/friend) a smartphone with video capability that we can use for your visit?" a. If yes - list this number in appt notes as "cell" (if different from BEST phone #) and list the appointment type as a VIDEO visit in appointment notes b. If no - list the appointment type as a PHONE visit in appointment notes  3. Confirm consent - "In the setting of the current Covid19 crisis, you are scheduled for a (phone or video) visit with your provider on (date) at (time).  Just as we do with many in-office visits, in order for you to participate in this visit, we must obtain consent.  If you'd like, I can send this to your mychart (if signed up) or email for you to review.  Otherwise, I can obtain your verbal consent now.  All virtual visits are billed to your insurance company just like a normal visit would be.  By agreeing to a virtual visit, we'd like you to understand that the technology does not allow for your provider to perform an examination, and thus may limit your provider's ability to fully assess your condition. If your provider identifies any concerns that need to be evaluated in person, we will make arrangements to do so.  Finally, though the technology is pretty good, we cannot assure that it will always work on either your or our end, and in the setting of a video visit, we may have to convert it to a phone-only visit.  In either situation, we cannot ensure that we have a secure connection.  Are you willing to proceed?" STAFF: Did the patient verbally acknowledge consent to telehealth visit? Document  YES/NO here: yes  4. Advise patient to be prepared - "Two hours prior to your appointment, go ahead and check your blood pressure, pulse, oxygen saturation, and your weight (if you have the equipment to check those) and write them all down. When your visit starts, your provider will ask you for this information. If you have an Apple Watch or Kardia device, please plan to have heart rate information ready on the day of your appointment. Please have a pen and paper handy nearby the day of the visit as well."  5. Give patient instructions for MyChart download to smartphone OR Doximity/Doxy.me as below if video visit (depending on what platform provider is using)  6. Inform patient they will receive a phone call 15 minutes prior to their appointment time (may be from unknown caller ID) so they should be prepared to answer    TELEPHONE CALL NOTE  Jeffery Atkins has been deemed a candidate for a follow-up tele-health visit to limit community exposure during the Covid-19 pandemic. I spoke with the patient via phone to ensure availability of phone/video source, confirm preferred email & phone number, and discuss instructions and expectations.  I reminded Jeffery Atkins to be prepared with any vital sign and/or heart rhythm information that could potentially be obtained via home monitoring, at the time of his visit. I reminded Jeffery Atkins to expect a phone call prior to his visit.  Clide Dales Lamiya Naas, CMA 06/24/2019 2:37 PM   INSTRUCTIONS FOR DOWNLOADING THE MYCHART APP TO SMARTPHONE  - The patient must first make sure to have activated MyChart and know their login information - If Apple, go to [employer] and type in MyChart in the search bar and download the app. If Android, ask patient to go to [employer] and type in [place] in the search bar and download the app. The app is free but as with any other app downloads, their phone may require them to verify saved payment information or Apple/Android  password.  - The patient will need to then log into the app with their MyChart username and password, and select [name] as their healthcare provider to link the account. When it is time for your visit, go to the MyChart app, find appointments, and click Begin Video Visit. Be sure to Select Allow for your device to access the Microphone and Camera for your visit. You will then be connected, and your provider will be with you shortly.  **If they have any issues connecting, or need assistance please contact MyChart service desk [phone])**  **If using a computer, in order to ensure the best quality for their visit they will need to use either of the following Internet Browsers: [employer] or Google Chrome**  IF USING DOXIMITY or DOXY.ME - The patient will receive a link just prior to their visit by text.     FULL LENGTH CONSENT FOR TELE-HEALTH VISIT   I hereby voluntarily request, consent and authorize [facility] and its employed or contracted physicians, physician assistants, nurse practitioners or other licensed health care professionals (the Practitioner), to provide me with telemedicine health care services (the "Services") as deemed necessary by the treating Practitioner. I acknowledge and consent to receive the Services by the Practitioner via telemedicine. I understand that the telemedicine visit will involve communicating with the Practitioner through live audiovisual communication technology and the disclosure of certain medical information by electronic transmission. I acknowledge that I have been given the opportunity to request an in-person assessment or other available alternative prior to the telemedicine visit and am voluntarily participating in the telemedicine visit.  I understand that I have the right to withhold or withdraw my consent to the use of telemedicine in the course of my care at any time, without affecting my right to future care or treatment,  and that the Practitioner or I may terminate the telemedicine visit at any time. I understand that I have the right to inspect all information obtained and/or recorded in the course of the telemedicine visit and may receive copies of available information for a reasonable fee.  I understand that some of the potential risks of receiving the Services via telemedicine include:  Marland Kitchen Delay or interruption in medical evaluation due to technological equipment failure or disruption; . Information transmitted may not be sufficient (e.g. poor resolution of images) to allow for appropriate medical decision making by the Practitioner; and/or  . In rare instances, security protocols could fail, causing a breach of personal health information.  Furthermore, I acknowledge that it is my responsibility to provide information about my medical history, conditions and care that is complete and accurate to the best of my ability. I acknowledge that Practitioner's advice, recommendations, and/or decision may be based on factors not within their control, such as incomplete or inaccurate data provided by me or distortions of diagnostic images or specimens that may result from electronic transmissions. I understand that  the practice of medicine is not an exact science and that Practitioner makes no warranties or guarantees regarding treatment outcomes. I acknowledge that I will receive a copy of this consent concurrently upon execution via email to the email address I last provided but may also request a printed copy by calling the office of [place].    I understand that my insurance will be billed for this visit.   I have read or had this consent read to me. . I understand the contents of this consent, which adequately explains the benefits and risks of the Services being provided via telemedicine.  . I have been provided ample opportunity to ask questions regarding this consent and the Services and have had my questions  answered to my satisfaction. . I give my informed consent for the services to be provided through the use of telemedicine in my medical care  By participating in this telemedicine visit I agree to the above.

## 2019-06-29 ENCOUNTER — Telehealth: Payer: Self-pay | Admitting: Cardiology

## 2019-07-05 IMAGING — CR DG CHEST 2V
2 series · 2 of 2 positions shown · non-contrast
Comparison: None in PACs

CLINICAL DATA: Tachycardia and left arm pain this morning. History
of hypertension, current smoker. Diabetes.

EXAM:
CHEST - 2 VIEW

[chest pa]
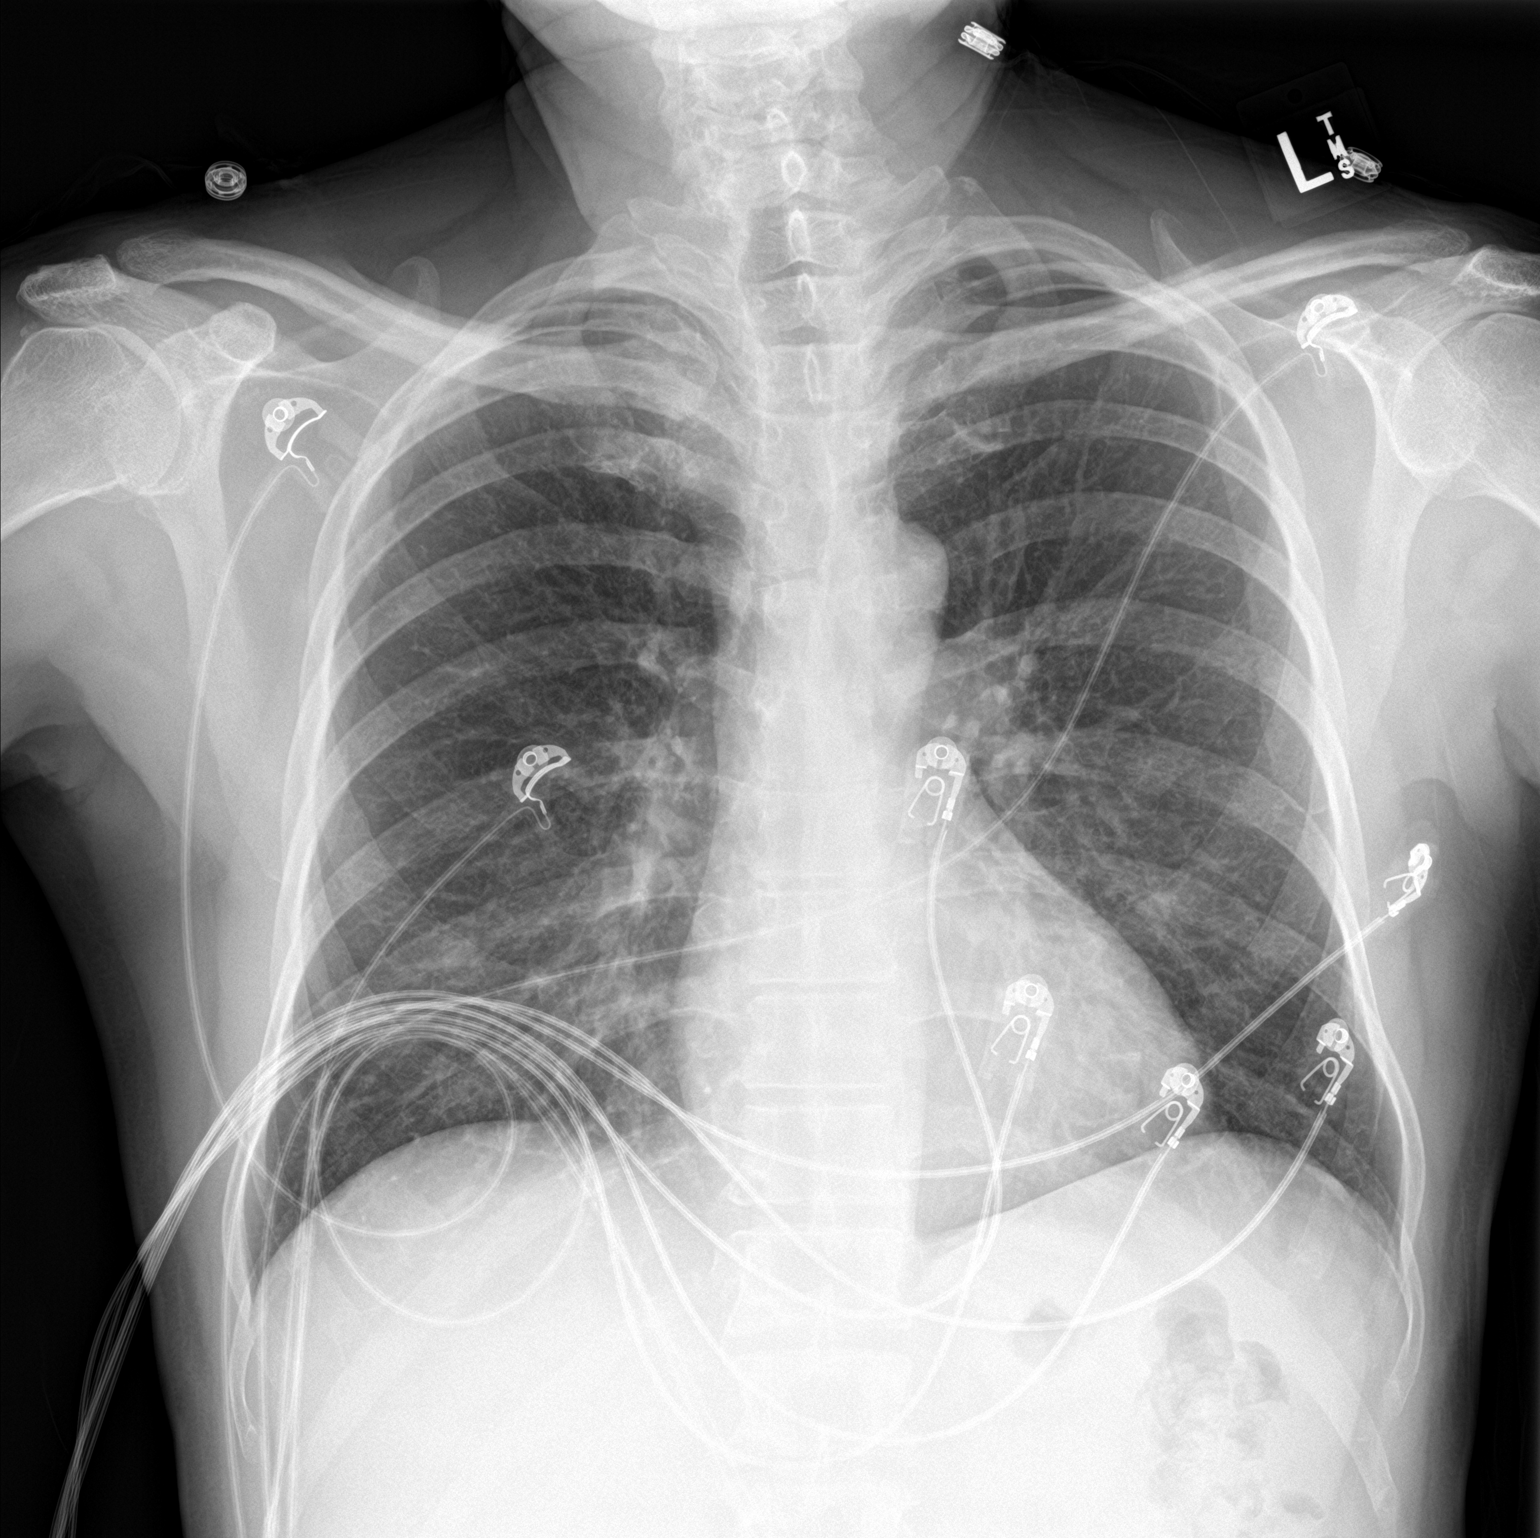

[chest lat]
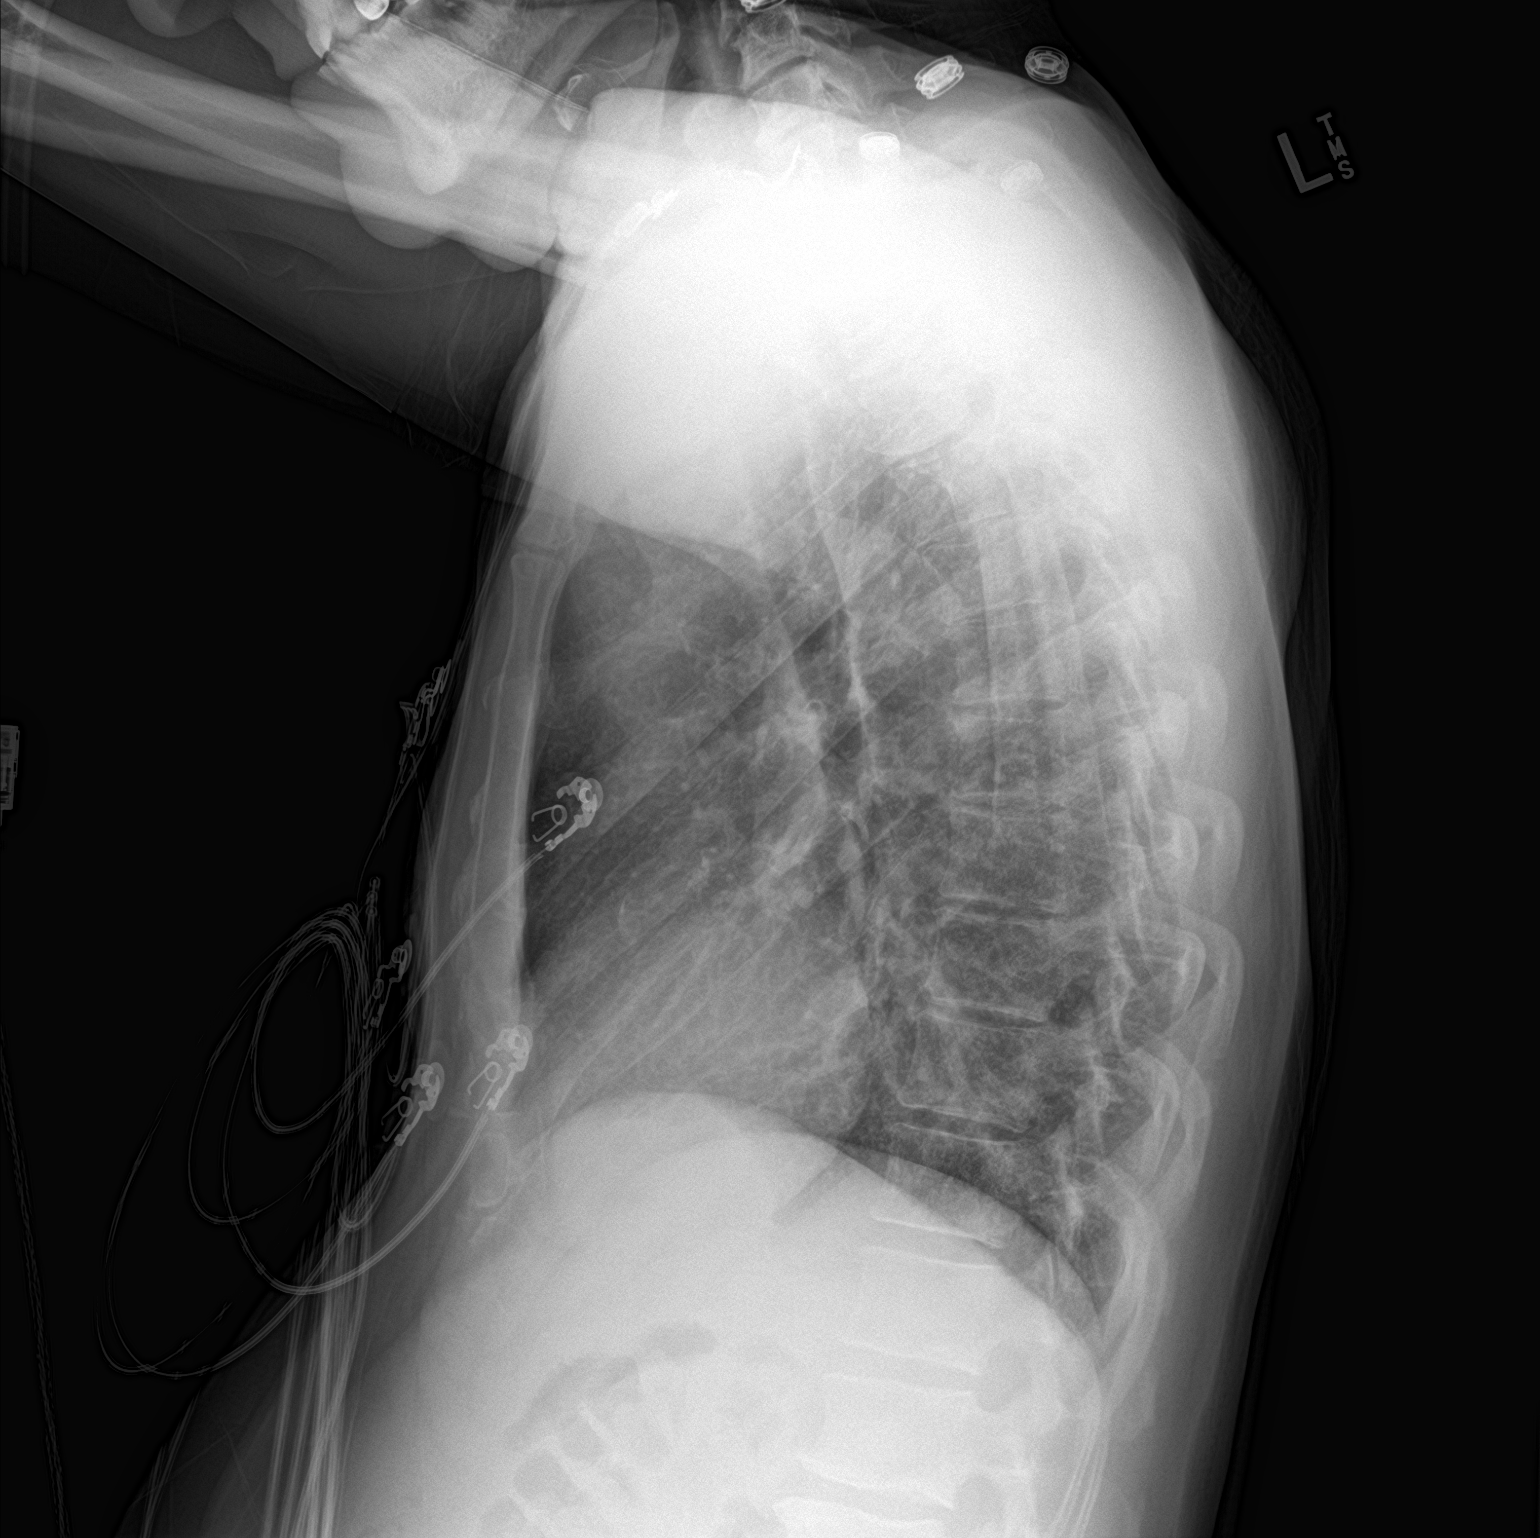

[2 of 2 positions shown; findings below may reference images not displayed]

FINDINGS: The lungs are adequately inflated. The lung markings are coarse in
the right infrahilar region posteriorly. The heart and pulmonary
vascularity are normal. The mediastinum is normal in width. There is
no pleural effusion. The bony thorax is unremarkable.
IMPRESSION: Right lower lobe atelectasis or early pneumonia. No CHF. Followup PA
and lateral chest X-ray is recommended in 3-4 weeks following trial
of antibiotic therapy to ensure resolution and exclude underlying
malignancy.

## 2019-07-08 ENCOUNTER — Other Ambulatory Visit: Payer: Self-pay | Admitting: Physician Assistant

## 2019-07-22 IMAGING — DX DG CHEST 2V
2 series · 2 of 2 positions shown · non-contrast
Comparison: Radiograph 11/25/2017

CLINICAL DATA: Follow-up atelectasis versus pneumonia on prior
radiograph.

EXAM:
CHEST - 2 VIEW

[dg chest 2 view (1 of 2)]
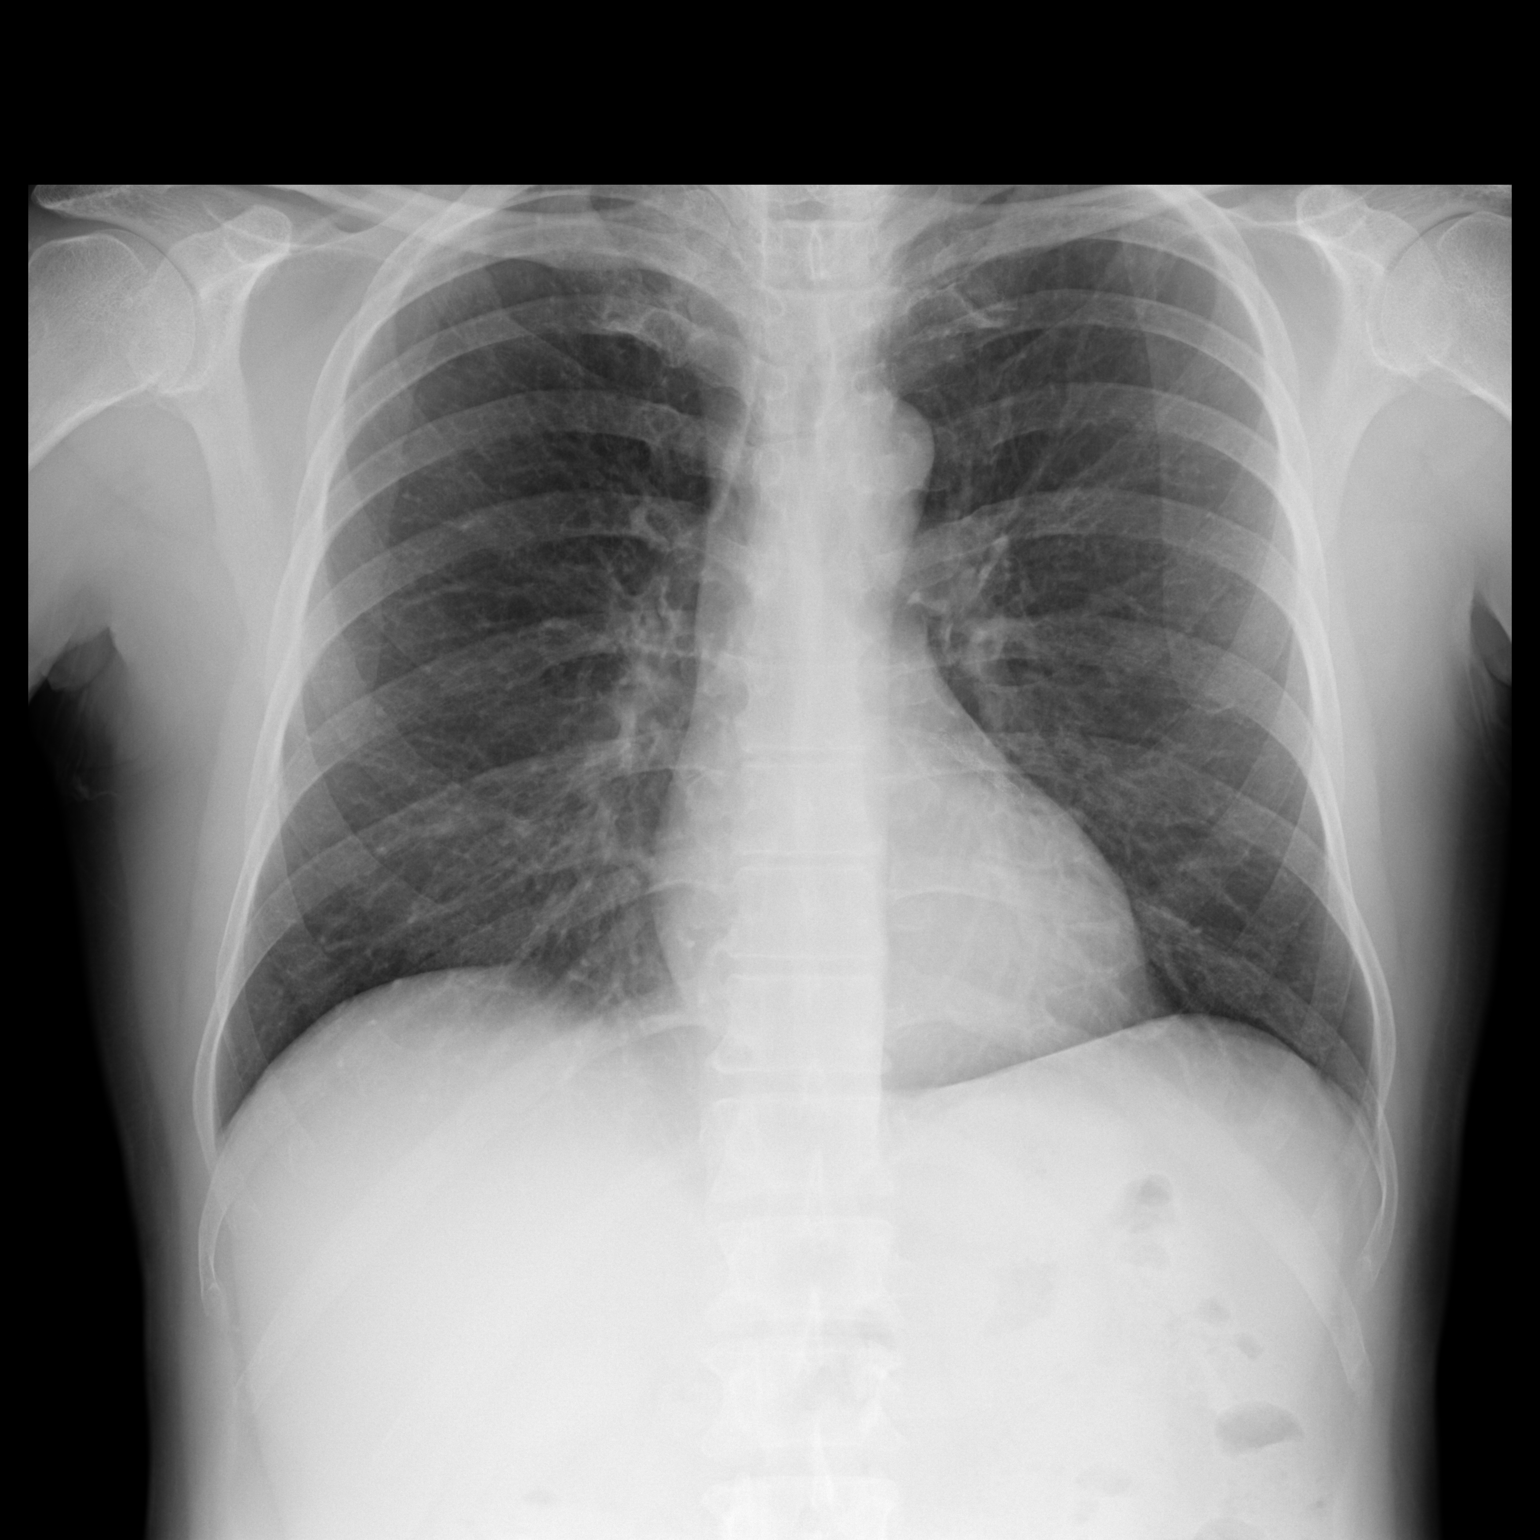

[dg chest 2 view (2 of 2)]
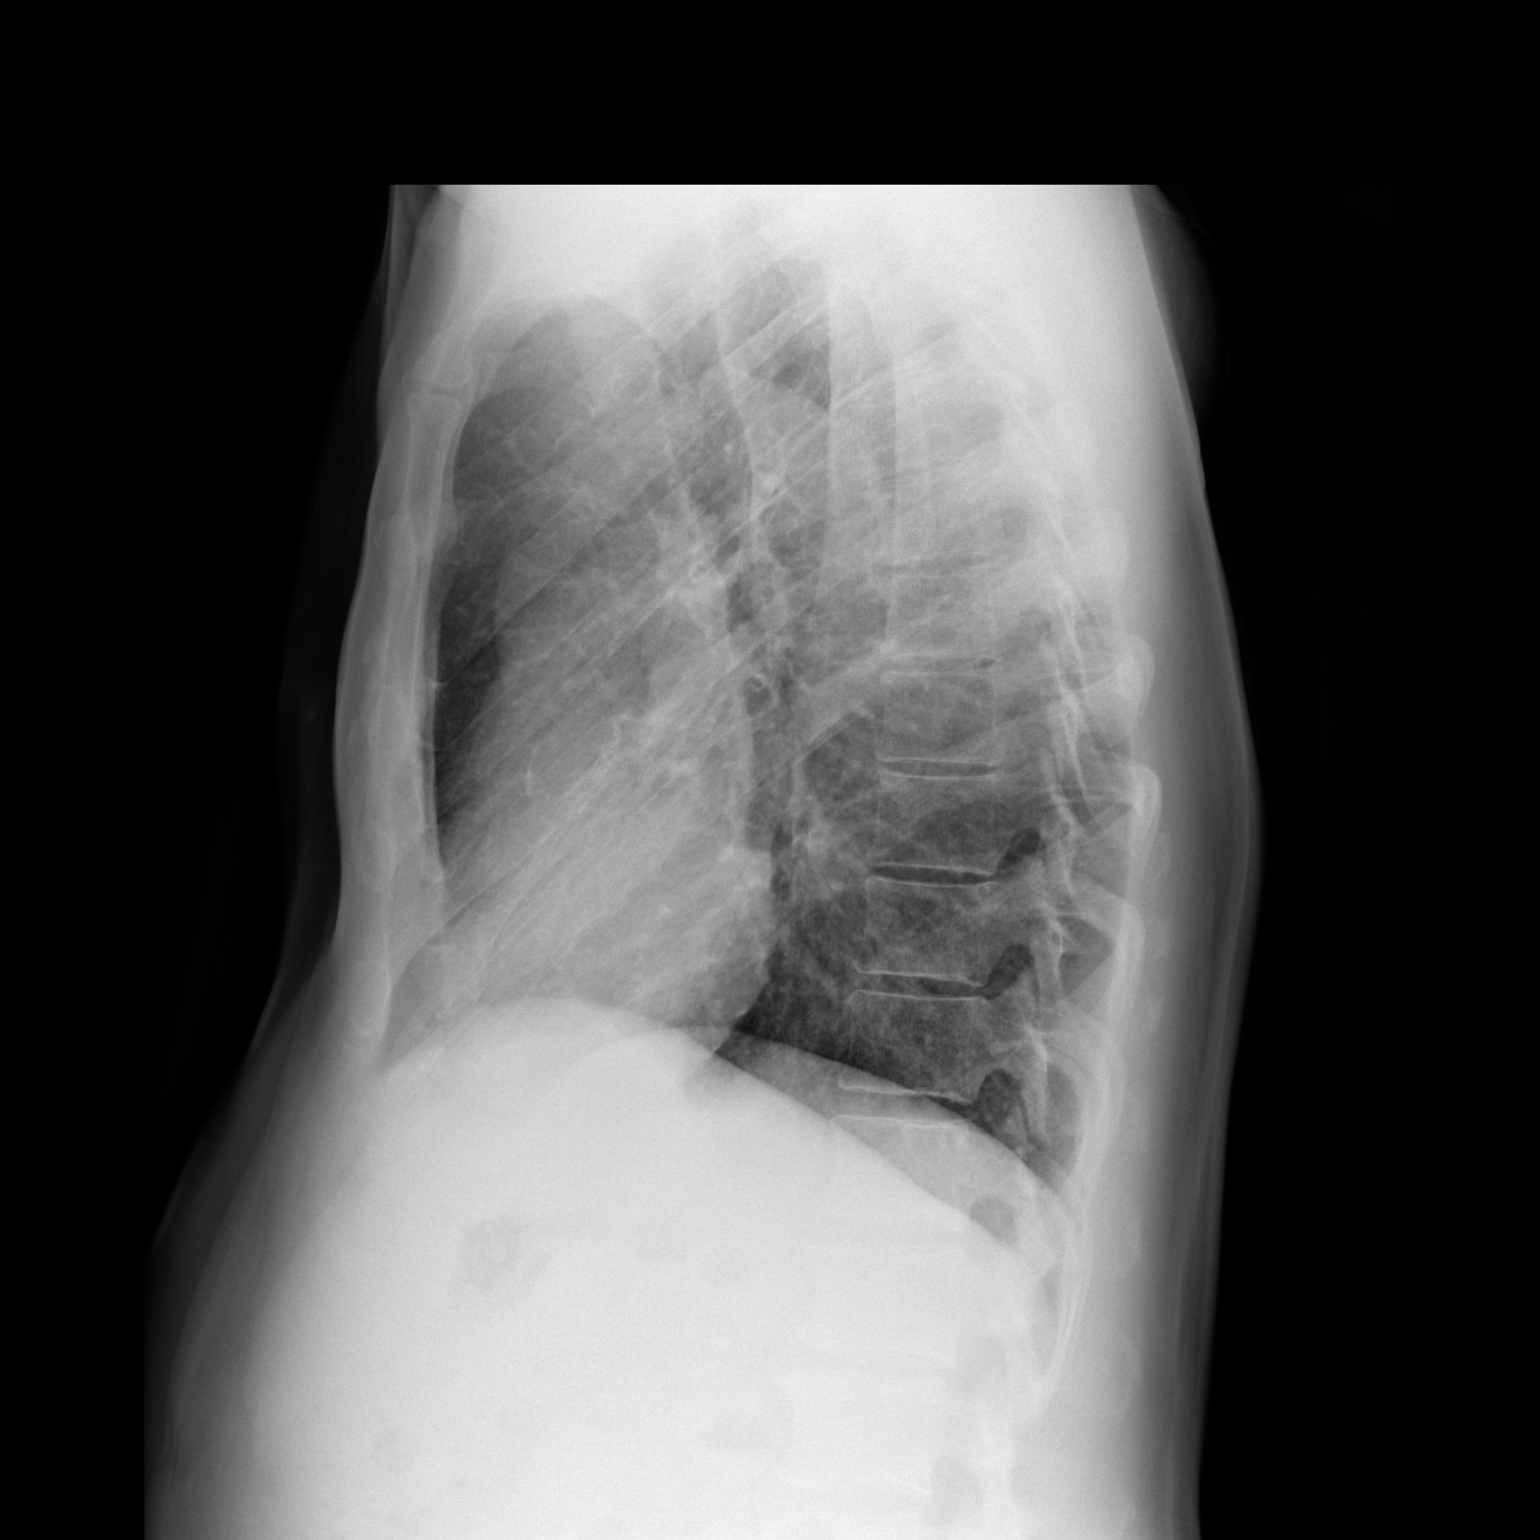

[2 of 2 positions shown; findings below may reference images not displayed]

FINDINGS: Clearing of prior right basilar airspace disease. No new
consolidation. The lungs are clear. Normal heart size and
mediastinal contours. No pulmonary edema, pleural effusion or
pneumothorax. No acute osseous abnormalities.
IMPRESSION: Clearing of right basilar airspace disease from prior exam. The
lungs are clear.

## 2019-07-26 ENCOUNTER — Other Ambulatory Visit: Payer: Self-pay | Admitting: Physician Assistant

## 2020-07-07 ENCOUNTER — Encounter (HOSPITAL_COMMUNITY): Payer: Self-pay | Admitting: Cardiovascular Disease
